# Patient Record
Sex: Male | Born: 1960 | Race: White | Hispanic: No | Smoking: Current some day smoker
Health system: Southern US, Community
[De-identification: ages and names within clinical notes are randomized; demographics above are authoritative.]

## PROBLEM LIST (undated history)

## (undated) DIAGNOSIS — R0600 Dyspnea, unspecified: Secondary | ICD-10-CM

## (undated) DIAGNOSIS — H9190 Unspecified hearing loss, unspecified ear: Secondary | ICD-10-CM

## (undated) DIAGNOSIS — K8689 Other specified diseases of pancreas: Secondary | ICD-10-CM

## (undated) DIAGNOSIS — N2889 Other specified disorders of kidney and ureter: Secondary | ICD-10-CM

## (undated) DIAGNOSIS — J449 Chronic obstructive pulmonary disease, unspecified: Secondary | ICD-10-CM

## (undated) DIAGNOSIS — I10 Essential (primary) hypertension: Secondary | ICD-10-CM

## (undated) DIAGNOSIS — K219 Gastro-esophageal reflux disease without esophagitis: Secondary | ICD-10-CM

## (undated) HISTORY — DX: Unspecified hearing loss, unspecified ear: H91.90

## (undated) HISTORY — PX: OPEN REDUCTION INTERNAL FIXATION (ORIF) CALCANEAL FRACTURE WITH FUSION: SHX5697

## (undated) HISTORY — DX: Other specified disorders of kidney and ureter: N28.89

## (undated) HISTORY — DX: Gastro-esophageal reflux disease without esophagitis: K21.9

## (undated) HISTORY — DX: Other specified diseases of pancreas: K86.89

## (undated) HISTORY — DX: Essential (primary) hypertension: I10

## (undated) HISTORY — PX: OTHER SURGICAL HISTORY: SHX169

---

## 2007-08-16 ENCOUNTER — Other Ambulatory Visit: Payer: Self-pay

## 2007-08-16 ENCOUNTER — Ambulatory Visit: Payer: Self-pay | Admitting: Gastroenterology

## 2008-04-07 ENCOUNTER — Emergency Department: Payer: Self-pay | Admitting: Emergency Medicine

## 2008-04-07 ENCOUNTER — Other Ambulatory Visit: Payer: Self-pay

## 2008-04-24 ENCOUNTER — Ambulatory Visit: Payer: Self-pay | Admitting: Gastroenterology

## 2008-05-09 ENCOUNTER — Ambulatory Visit: Payer: Self-pay | Admitting: Gastroenterology

## 2009-02-06 ENCOUNTER — Emergency Department: Payer: Self-pay | Admitting: Emergency Medicine

## 2016-03-05 ENCOUNTER — Telehealth: Payer: Self-pay

## 2016-03-05 MED ORDER — LOSARTAN POTASSIUM-HCTZ 50-12.5 MG PO TABS: 1.0000 | ORAL_TABLET | Freq: Every day | ORAL | Status: DC

## 2016-03-05 MED ORDER — NEBIVOLOL HCL 5 MG PO TABS: 5.0000 mg | ORAL_TABLET | Freq: Every day | ORAL | Status: DC

## 2016-03-05 NOTE — Telephone Encounter (Signed)
Authorized 30 day refill until he can be seen.

## 2016-03-05 NOTE — Telephone Encounter (Signed)
Patient has new patient appt on 03/23/16 and is our of medication.  I have updated pharmacy in patients chart.

## 2016-03-23 ENCOUNTER — Ambulatory Visit (INDEPENDENT_AMBULATORY_CARE_PROVIDER_SITE_OTHER): Payer: Medicare HMO | Admitting: Family Medicine

## 2016-03-23 ENCOUNTER — Encounter: Payer: Self-pay | Admitting: Family Medicine

## 2016-03-23 VITALS — BP 117/76 | HR 77 | Temp 98.8°F | Resp 16 | Ht 64.0 in | Wt 172.4 lb

## 2016-03-23 DIAGNOSIS — Z7189 Other specified counseling: Secondary | ICD-10-CM | POA: Diagnosis not present

## 2016-03-23 DIAGNOSIS — I10 Essential (primary) hypertension: Secondary | ICD-10-CM | POA: Diagnosis not present

## 2016-03-23 DIAGNOSIS — Z7689 Persons encountering health services in other specified circumstances: Secondary | ICD-10-CM

## 2016-03-23 DIAGNOSIS — K219 Gastro-esophageal reflux disease without esophagitis: Secondary | ICD-10-CM | POA: Diagnosis not present

## 2016-03-23 MED ORDER — LOSARTAN POTASSIUM-HCTZ 50-12.5 MG PO TABS: 1.0000 | ORAL_TABLET | Freq: Every day | ORAL | Status: DC

## 2016-03-23 MED ORDER — RANITIDINE HCL 150 MG PO TABS: 150.0000 mg | ORAL_TABLET | Freq: Two times a day (BID) | ORAL | Status: DC

## 2016-03-23 MED ORDER — NEBIVOLOL HCL 5 MG PO TABS: 5.0000 mg | ORAL_TABLET | Freq: Every day | ORAL | Status: DC

## 2016-03-23 NOTE — Progress Notes (Signed)
Subjective:    Patient ID: Eugene Burton, male    DOB: [birth year], 55 y.o.   MRN: [id number]  HPI: Eugene Burton is a 55 y.o. male presenting on 03/23/2016 for Establish Care   HPI  Pt presents to establish care today. Previous care provider was Dr. Laurian Brim  It has been 6 months since His last PCP visit. Records from previous provider will be requested and reviewed. 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 His wife is interpreting for him today.   Hypertension: Taking losartan-hctz, and bystolic once daily. Controlled well. No dizziness. No chest pain. No visual changes.  Acid reflux: Has been on prevacid- had narrowing of esophagus. Was told to take prevacid. Does well. Has tried without it and would have dysphagia.   Health maintenance: Declines colonoscopy or PSA screening.    Pt and wife are reluctant to get lab work.   Past Medical History  Diagnosis Date  . Hypertension   . GERD (gastroesophageal reflux disease)    Social History   Social History  . Marital Status: Married    Spouse Name: N/A  . Number of Children: N/A  . Years of Education: N/A   Occupational History  . Not on file.   Social History Main Topics  . Smoking status: Current Some Day Smoker -- 0.50 packs/day  . Smokeless tobacco: Not on file  . Alcohol Use: No  . Drug Use: No  . Sexual Activity: Not on file   Other Topics Concern  . Not on file   Social History Narrative  . No narrative on file   Family History  Problem Relation Age of Onset  . Deafness Sister    No current outpatient prescriptions on file prior to visit.   No current facility-administered medications on file prior to visit.    Review of Systems  Constitutional: Negative for fever and chills.  HENT: Negative.   Respiratory: Negative for chest tightness, shortness of breath and wheezing.   Cardiovascular: Negative for chest pain, palpitations and leg swelling.    Gastrointestinal: Negative for nausea, vomiting and abdominal pain.  Endocrine: Negative.   Genitourinary: Negative for dysuria, urgency, discharge, penile pain and testicular pain.  Musculoskeletal: Negative for back pain, joint swelling and arthralgias.  Skin: Negative.   Neurological: Negative for dizziness, weakness, numbness and headaches.  Psychiatric/Behavioral: Negative for sleep disturbance and dysphoric mood.   Per HPI unless specifically indicated above     Objective:    BP 117/76 mmHg  Pulse 77  Temp(Src) 98.8 F (37.1 C) (Oral)  Resp 16  Ht 5\' 4"  (1.626 m)  Wt 172 lb 6.4 oz (78.2 kg)  BMI 29.58 kg/m2  Wt Readings from Last 3 Encounters:  03/23/16 172 lb 6.4 oz (78.2 kg)    Physical Exam  Constitutional: He is oriented to person, place, and time. He appears well-developed and well-nourished. No distress.  HENT:  Head: Normocephalic and atraumatic.  Neck: Neck supple. No thyromegaly present.  Cardiovascular: Normal rate, regular rhythm and normal heart sounds.  Exam reveals no gallop and no friction rub.   No murmur heard. Pulmonary/Chest: Effort normal and breath sounds normal. He has no wheezes.  Abdominal: Soft. Bowel sounds are normal. He exhibits no distension. There is no tenderness. There is no rebound.  Musculoskeletal: Normal range of motion. He exhibits no edema or tenderness.  Neurological: He is alert and oriented to person, place, and time. He has normal reflexes.  Skin: Skin is warm and dry. No rash noted. No erythema.  Psychiatric: He has a normal mood and affect. His behavior is normal. Thought content normal.   No results found for this or any previous visit.    Assessment & Plan:   Problem List Items Addressed This Visit      Cardiovascular and Mediastinum   HTN (hypertension)    Have stressed the importance of getting regular labwork to patient and wife. Would like to check baseline kidney function given diurectics. Pt and wife do not want  labwork today- have asked that it be done in the next 6 mos.  Renewed medications.  RTC 3 mos.  CMP and lipid ordered.       Relevant Medications   losartan-hydrochlorothiazide (HYZAAR) 50-12.5 MG tablet   nebivolol (BYSTOLIC) 5 MG tablet   Other Relevant Orders   COMPLETE METABOLIC PANEL WITH GFR   Lipid Profile     Digestive   GERD (gastroesophageal reflux disease)    Trial of ranitidine due to effects of long-term PPI. Ranitidine BID. Check CBC. Recheck 3 mos.       Relevant Medications   ranitidine (ZANTAC) 150 MG tablet   Other Relevant Orders   CBC with Differential/Platelet    Other Visit Diagnoses    Encounter to establish care    -  Primary       Meds ordered this encounter  Medications  . DISCONTD: lansoprazole (PREVACID) 15 MG capsule    Sig: Take 15 mg by mouth 2 (two) times daily.   . ranitidine (ZANTAC) 150 MG tablet    Sig: Take 1 tablet (150 mg total) by mouth 2 (two) times daily.    Dispense:  60 tablet    Refill:  11    Order Specific Question:  Supervising Provider    Answer:  Arlis Porta [phone]  . losartan-hydrochlorothiazide (HYZAAR) 50-12.5 MG tablet    Sig: Take 1 tablet by mouth daily.    Dispense:  30 tablet    Refill:  5    Order Specific Question:  Supervising Provider    Answer:  Arlis Porta [id number]  . nebivolol (BYSTOLIC) 5 MG tablet    Sig: Take 1 tablet (5 mg total) by mouth daily.    Dispense:  30 tablet    Refill:  5    Order Specific Question:  Supervising Provider    Answer:  Arlis Porta [id number]      Follow up plan: Return in about 3 months (around 06/23/2016), or if symptoms worsen or fail to improve, for hypertension. Marland Kitchen

## 2016-03-23 NOTE — Assessment & Plan Note (Signed)
Trial of ranitidine due to effects of long-term PPI. Ranitidine BID. Check CBC. Recheck 3 mos.

## 2016-03-23 NOTE — Assessment & Plan Note (Signed)
Have stressed the importance of getting regular labwork to patient and wife. Would like to check baseline kidney function given diurectics. Pt and wife do not want labwork today- have asked that it be done in the next 6 mos.  Renewed medications.  RTC 3 mos.  CMP and lipid ordered.

## 2016-03-23 NOTE — Patient Instructions (Signed)
Your goal blood pressure is  1490/90. Work on low salt/sodium diet - goal <1.5gm (1,500mg ) per day. Eat a diet high in fruits/vegetables and whole grains.  Look into mediterranean and DASH diet. Goal activity is 120min/wk of moderate intensity exercise.  This can be split into 30 minute chunks.  If you are not at this level, you can start with smaller 10-15 min increments and slowly build up activity. Look at [place]org for more resources  It is important that we get lab work on you today to ensure it is safe to continue to give your medications.  You can make an appt anytime to get your labs done.

## 2016-09-28 ENCOUNTER — Other Ambulatory Visit: Payer: Self-pay | Admitting: Family Medicine

## 2016-09-28 DIAGNOSIS — I10 Essential (primary) hypertension: Secondary | ICD-10-CM

## 2016-09-28 MED ORDER — LOSARTAN POTASSIUM-HCTZ 50-12.5 MG PO TABS: 1.0000 | ORAL_TABLET | Freq: Every day | ORAL | 2 refills | Status: DC

## 2016-10-27 ENCOUNTER — Other Ambulatory Visit: Payer: Self-pay | Admitting: *Deleted

## 2016-10-27 DIAGNOSIS — I10 Essential (primary) hypertension: Secondary | ICD-10-CM

## 2016-10-27 MED ORDER — NEBIVOLOL HCL 5 MG PO TABS: 5.0000 mg | ORAL_TABLET | Freq: Every day | ORAL | 1 refills | Status: DC

## 2018-05-10 ENCOUNTER — Ambulatory Visit: Payer: Medicare HMO

## 2018-05-23 ENCOUNTER — Ambulatory Visit: Payer: Self-pay

## 2018-06-01 ENCOUNTER — Ambulatory Visit: Payer: Self-pay | Admitting: Urology

## 2018-06-01 VITALS — BP 147/90 | HR 94 | Temp 98.6°F | Ht 64.0 in | Wt 137.2 lb

## 2018-06-01 DIAGNOSIS — K219 Gastro-esophageal reflux disease without esophagitis: Secondary | ICD-10-CM

## 2018-06-01 DIAGNOSIS — I10 Essential (primary) hypertension: Secondary | ICD-10-CM

## 2018-06-01 MED ORDER — NEBIVOLOL HCL 5 MG PO TABS: 5.0000 mg | ORAL_TABLET | Freq: Every day | ORAL | 1 refills | Status: DC

## 2018-06-01 MED ORDER — RANITIDINE HCL 150 MG PO TABS: 150.0000 mg | ORAL_TABLET | Freq: Two times a day (BID) | ORAL | 1 refills | Status: DC

## 2018-06-01 MED ORDER — LOSARTAN POTASSIUM-HCTZ 50-12.5 MG PO TABS
1.0000 | ORAL_TABLET | Freq: Every day | ORAL | 1 refills | Status: DC
Start: 2018-06-01 — End: 2018-08-03

## 2018-06-01 NOTE — Progress Notes (Signed)
  Patient: Eugene Burton Male    DOB: Jan 15, 1961   57 y.o.   MRN: [id number] Visit Date: 06/01/2018  Today's Provider: Zara Council, PA-C   Chief Complaint  Patient presents with  . Annual Exam    new patient, needs medication review and managment    Subjective:    HPI Patient states his glasses are old and he is having trouble seeing and would like an eye exam  Has HTN and GERD - been without medication since 2018/02/13  His wife died suddenly in 16-May-2018    No Known Allergies Previous Medications   LOSARTAN-HYDROCHLOROTHIAZIDE (HYZAAR) 50-12.5 MG TABLET    Take 1 tablet by mouth daily.   NEBIVOLOL (BYSTOLIC) 5 MG TABLET    Take 1 tablet (5 mg total) by mouth daily.   RANITIDINE (ZANTAC) 150 MG TABLET    Take 1 tablet (150 mg total) by mouth 2 (two) times daily.    Review of Systems  Constitutional: Positive for appetite change.    Social History   Tobacco Use  . Smoking status: Current Some Day Smoker    Packs/day: 1.00    Types: Cigarettes  . Smokeless tobacco: Never Used  . Tobacco comment: materials given to patient as he is "slightly" interested in quittig  Substance Use Topics  . Alcohol use: No   Objective:   BP (!) 147/90   Pulse 94   Temp 98.6 F (37 C)   Ht 5\' 4"  (1.626 m)   Wt 137 lb 3.2 oz (62.2 kg)   BMI 23.55 kg/m   Physical Exam  Constitutional: He is oriented to person, place, and time. He appears well-developed.  Eyes: Pupils are equal, round, and reactive to light.  Neck: Normal range of motion.  Cardiovascular: Normal rate, regular rhythm and normal heart sounds.  Pulmonary/Chest: Effort normal and breath sounds normal.  Abdominal: Soft. Bowel sounds are normal.  Musculoskeletal: Normal range of motion.  Neurological: He is oriented to person, place, and time.  Skin: Skin is warm and dry.        Assessment & Plan:     1.HTN  meds refilled  Labs drawn  2. GERD   Zantac refilled  3. Depression  seeig [facility]       Zara Council, PA-C   [facility]

## 2018-06-02 LAB — CBC WITH DIFFERENTIAL/PLATELET
BASOS ABS: 0 10*3/uL (ref 0.0–0.2)
Basos: 0 %
EOS (ABSOLUTE): 0.1 10*3/uL (ref 0.0–0.4)
Eos: 2 %
Hematocrit: 36.7 % — ABNORMAL LOW (ref 37.5–51.0)
Hemoglobin: 12 g/dL — ABNORMAL LOW (ref 13.0–17.7)
Immature Grans (Abs): 0 10*3/uL (ref 0.0–0.1)
Immature Granulocytes: 0 %
LYMPHS: 21 %
Lymphocytes Absolute: 1.6 10*3/uL (ref 0.7–3.1)
MCH: 28.9 pg (ref 26.6–33.0)
MCHC: 32.7 g/dL (ref 31.5–35.7)
MCV: 88 fL (ref 79–97)
MONOS ABS: 0.6 10*3/uL (ref 0.1–0.9)
Monocytes: 8 %
NEUTROS ABS: 5.5 10*3/uL (ref 1.4–7.0)
Neutrophils: 69 %
Platelets: 382 10*3/uL (ref 150–450)
RBC: 4.15 x10E6/uL (ref 4.14–5.80)
RDW: 15.8 % — AB (ref 12.3–15.4)
WBC: 7.9 10*3/uL (ref 3.4–10.8)

## 2018-06-02 LAB — COMPREHENSIVE METABOLIC PANEL
A/G RATIO: 1.7 (ref 1.2–2.2)
ALT: 12 IU/L (ref 0–44)
AST: 17 IU/L (ref 0–40)
Albumin: 4.1 g/dL (ref 3.5–5.5)
Alkaline Phosphatase: 120 IU/L — ABNORMAL HIGH (ref 39–117)
BILIRUBIN TOTAL: 0.3 mg/dL (ref 0.0–1.2)
BUN / CREAT RATIO: 10 (ref 9–20)
BUN: 11 mg/dL (ref 6–24)
CO2: 26 mmol/L (ref 20–29)
Calcium: 9.9 mg/dL (ref 8.7–10.2)
Chloride: 98 mmol/L (ref 96–106)
Creatinine, Ser: 1.1 mg/dL (ref 0.76–1.27)
GFR calc non Af Amer: 75 mL/min/{1.73_m2} (ref >59)
GFR, EST AFRICAN AMERICAN: 87 mL/min/{1.73_m2} (ref >59)
GLOBULIN, TOTAL: 2.4 g/dL (ref 1.5–4.5)
Glucose: 95 mg/dL (ref 65–99)
POTASSIUM: 4 mmol/L (ref 3.5–5.2)
SODIUM: 141 mmol/L (ref 134–144)
TOTAL PROTEIN: 6.5 g/dL (ref 6.0–8.5)

## 2018-06-02 LAB — TSH: TSH: 2 u[IU]/mL (ref 0.450–4.500)

## 2018-06-02 LAB — PSA: PROSTATE SPECIFIC AG, SERUM: 1.2 ng/mL (ref 0.0–4.0)

## 2018-06-02 LAB — LIPID PANEL
CHOL/HDL RATIO: 4.4 ratio (ref 0.0–5.0)
Cholesterol, Total: 146 mg/dL (ref 100–199)
HDL: 33 mg/dL — ABNORMAL LOW (ref >39)
LDL Calculated: 88 mg/dL (ref 0–99)
Triglycerides: 127 mg/dL (ref 0–149)
VLDL Cholesterol Cal: 25 mg/dL (ref 5–40)

## 2018-06-09 ENCOUNTER — Telehealth: Payer: Self-pay | Admitting: Pharmacy Technician

## 2018-06-09 NOTE — Telephone Encounter (Signed)
Insurance check revealed patient has [employer] to cover medical and pharmacy costs.  Attempted to call patient.  Phone number on file disconnected.  [facility] is unable to provide medication assistance since patient has prescription coverage.  Notifying patient by letter.  [facility]

## 2018-06-15 ENCOUNTER — Ambulatory Visit: Payer: Medicare HMO | Admitting: Ophthalmology

## 2018-06-23 ENCOUNTER — Ambulatory Visit: Payer: Self-pay

## 2018-06-29 ENCOUNTER — Encounter: Payer: Self-pay | Admitting: Gerontology

## 2018-06-29 ENCOUNTER — Ambulatory Visit: Payer: Self-pay | Admitting: Adult Health

## 2018-06-29 ENCOUNTER — Ambulatory Visit: Payer: Medicare HMO | Admitting: Gerontology

## 2018-06-29 ENCOUNTER — Other Ambulatory Visit: Payer: Self-pay

## 2018-06-29 VITALS — BP 125/77 | HR 110 | Temp 97.7°F | Wt 134.4 lb

## 2018-06-29 DIAGNOSIS — K219 Gastro-esophageal reflux disease without esophagitis: Secondary | ICD-10-CM

## 2018-06-29 DIAGNOSIS — I10 Essential (primary) hypertension: Secondary | ICD-10-CM

## 2018-06-29 NOTE — Progress Notes (Signed)
  Patient: Eugene Burton Male    DOB: 1961-06-20   57 y.o.   MRN: [id number] Visit Date: 06/29/2018  Today's Provider: Langston Reusing, NP   Chief Complaint  Patient presents with  . Follow-up    hypertension   Subjective:    HPI [name] 57 y/o male presents for f/u with HTN, Gerd. Has a sign language interpreter with him. He denies chest pain, palpitation, shortness of breath, but continues to smoke, states he will quit on [birth year] which is his birthday. He states taking medicines as ordered.Patient went for eye exam and has new glasses. Denies any depressive mood.    No Known Allergies Previous Medications   LOSARTAN-HYDROCHLOROTHIAZIDE (HYZAAR) 50-12.5 MG TABLET    Take 1 tablet by mouth daily.   NEBIVOLOL (BYSTOLIC) 5 MG TABLET    Take 1 tablet (5 mg total) by mouth daily.   RANITIDINE (ZANTAC) 150 MG TABLET    Take 1 tablet (150 mg total) by mouth 2 (two) times daily.    Review of Systems  Constitutional: Negative.   HENT: Negative.   Eyes: Negative.   Respiratory: Negative.   Cardiovascular: Negative.   Gastrointestinal: Negative.   Endocrine: Negative.   Genitourinary: Negative.   Musculoskeletal: Negative.   Skin: Negative.   Neurological: Negative.   Psychiatric/Behavioral: Negative.     Social History   Tobacco Use  . Smoking status: Current Some Day Smoker    Packs/day: 1.00    Types: Cigarettes  . Smokeless tobacco: Never Used  . Tobacco comment: materials given to patient as he is "slightly" interested in quittig  Substance Use Topics  . Alcohol use: No   Objective:   BP 125/77 (BP Location: Left Arm, Patient Position: Sitting)   Pulse (!) 110   Temp 97.7 F (36.5 C)   Wt 134 lb 6.4 oz (61 kg)   SpO2 99%   BMI 23.07 kg/m   Physical Exam  Constitutional: He is oriented to person, place, and time. He appears well-developed and well-nourished.  HENT:  Head: Normocephalic and atraumatic.  Eyes: Pupils are equal, round, and  reactive to light. Conjunctivae and EOM are normal.  Neck: Normal range of motion.  Cardiovascular: Tachycardia present.  Pulmonary/Chest: Effort normal and breath sounds normal.  Abdominal: Soft. Bowel sounds are normal.  Musculoskeletal: Normal range of motion.  Neurological: He is alert and oriented to person, place, and time.  Skin: Skin is warm and dry.  Psychiatric: He has a normal mood and affect. His behavior is normal. Judgment and thought content normal.        Assessment & Plan:     Essential hypertension  Gastroesophageal reflux disease without esophagitis    Essential Hypertension:    Continues to take Losartan-hydrochlorothiazide 50-12.5mg  daily, and nebivolol 5 mg daily .blood pressure is within normal limit. smoking cessation advised, EKG for tachycardia. Follow up next week with BP and HR nurse check  Gerd:  Continue taking 150 mg Zantac twice daily     Austynn Pridmore Jerold Coombe, NP   [facility]

## 2018-07-06 ENCOUNTER — Ambulatory Visit: Payer: Medicare HMO

## 2018-07-06 ENCOUNTER — Other Ambulatory Visit: Payer: Self-pay

## 2018-07-06 VITALS — BP 134/84 | HR 137

## 2018-07-06 DIAGNOSIS — I10 Essential (primary) hypertension: Secondary | ICD-10-CM

## 2018-07-06 MED ORDER — NEBIVOLOL HCL 5 MG PO TABS: 5.0000 mg | ORAL_TABLET | Freq: Every day | ORAL | 1 refills | Status: DC

## 2018-07-06 NOTE — Patient Instructions (Signed)

## 2018-07-06 NOTE — Progress Notes (Signed)
Patient was seen no c/o patient had not received his Bystolic from the pharmacy.  We resent the medication to [facility] on [address] and ensured it had been received.  Patients BP and HR was reviewed by Langston Reusing, NP.  She advised patient to go to ER for HR.  Called patient to inform him because of lack of transportation patient asked if he could wait to go to ER.  Because the HR was not extremely high the patient was informed to watch for SOB, dizziness, palpitations and clamminess. Patient will pick up his Beta Blocker from the pharmacy and return on 10/29 for a recheck on his HR. Subjective:    Patient here for follow-up of elevated blood pressure.  He  exercising and  adherent to a low-salt diet.  Blood pressure  well controlled at home. Cardiac symptoms: . Patient denies: . Cardiovascular risk factors: . Use of agents associated with hypertension: . History of target organ damage: .    Review of Systems      Objective:        Assessment:    Hypertension,  ***. Evidence of target organ damage: .    Plan:

## 2018-08-03 ENCOUNTER — Other Ambulatory Visit: Payer: Self-pay

## 2018-08-03 ENCOUNTER — Ambulatory Visit: Payer: Medicare HMO | Admitting: Gerontology

## 2018-08-03 ENCOUNTER — Encounter: Payer: Self-pay | Admitting: Gerontology

## 2018-08-03 ENCOUNTER — Other Ambulatory Visit: Payer: Self-pay | Admitting: Urology

## 2018-08-03 VITALS — BP 150/87 | HR 96 | Wt 128.0 lb

## 2018-08-03 DIAGNOSIS — I10 Essential (primary) hypertension: Secondary | ICD-10-CM

## 2018-08-03 DIAGNOSIS — K219 Gastro-esophageal reflux disease without esophagitis: Secondary | ICD-10-CM

## 2018-08-03 DIAGNOSIS — R05 Cough: Secondary | ICD-10-CM

## 2018-08-03 DIAGNOSIS — R059 Cough, unspecified: Secondary | ICD-10-CM

## 2018-08-03 MED ORDER — BENZONATATE 100 MG PO CAPS: 100.0000 mg | ORAL_CAPSULE | Freq: Three times a day (TID) | ORAL | 0 refills | Status: DC | PRN

## 2018-08-03 MED ORDER — LOSARTAN POTASSIUM-HCTZ 50-12.5 MG PO TABS: 1.0000 | ORAL_TABLET | Freq: Every day | ORAL | 3 refills | Status: DC

## 2018-08-03 MED ORDER — NEBIVOLOL HCL 5 MG PO TABS: 5.0000 mg | ORAL_TABLET | Freq: Every day | ORAL | 3 refills | Status: DC

## 2018-08-03 MED ORDER — RANITIDINE HCL 150 MG PO TABS: 150.0000 mg | ORAL_TABLET | Freq: Two times a day (BID) | ORAL | 3 refills | Status: DC
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
## 2018-09-18 ENCOUNTER — Emergency Department: Payer: Medicare HMO

## 2018-09-18 ENCOUNTER — Encounter: Payer: Self-pay | Admitting: Emergency Medicine

## 2018-09-18 ENCOUNTER — Other Ambulatory Visit: Payer: Self-pay

## 2018-09-18 ENCOUNTER — Inpatient Hospital Stay
Admission: EM | Admit: 2018-09-18 | Discharge: 2018-09-22 | DRG: 843 | Disposition: A | Discharge status: Home / Self Care | Payer: Medicare HMO | Attending: Internal Medicine | Admitting: Internal Medicine

## 2018-09-18 DIAGNOSIS — J9601 Acute respiratory failure with hypoxia: Secondary | ICD-10-CM | POA: Diagnosis not present

## 2018-09-18 DIAGNOSIS — C801 Malignant (primary) neoplasm, unspecified: Secondary | ICD-10-CM | POA: Diagnosis not present

## 2018-09-18 DIAGNOSIS — C786 Secondary malignant neoplasm of retroperitoneum and peritoneum: Secondary | ICD-10-CM | POA: Diagnosis present

## 2018-09-18 DIAGNOSIS — Z981 Arthrodesis status: Secondary | ICD-10-CM

## 2018-09-18 DIAGNOSIS — E860 Dehydration: Secondary | ICD-10-CM | POA: Diagnosis not present

## 2018-09-18 DIAGNOSIS — M25551 Pain in right hip: Secondary | ICD-10-CM | POA: Diagnosis present

## 2018-09-18 DIAGNOSIS — M6250 Muscle wasting and atrophy, not elsewhere classified, unspecified site: Secondary | ICD-10-CM | POA: Diagnosis present

## 2018-09-18 DIAGNOSIS — C7951 Secondary malignant neoplasm of bone: Secondary | ICD-10-CM | POA: Diagnosis present

## 2018-09-18 DIAGNOSIS — G4700 Insomnia, unspecified: Secondary | ICD-10-CM | POA: Diagnosis present

## 2018-09-18 DIAGNOSIS — E440 Moderate protein-calorie malnutrition: Secondary | ICD-10-CM

## 2018-09-18 DIAGNOSIS — K219 Gastro-esophageal reflux disease without esophagitis: Secondary | ICD-10-CM | POA: Diagnosis present

## 2018-09-18 DIAGNOSIS — R64 Cachexia: Secondary | ICD-10-CM | POA: Diagnosis present

## 2018-09-18 DIAGNOSIS — H913 Deaf nonspeaking, not elsewhere classified: Secondary | ICD-10-CM | POA: Diagnosis present

## 2018-09-18 DIAGNOSIS — D630 Anemia in neoplastic disease: Secondary | ICD-10-CM | POA: Diagnosis present

## 2018-09-18 DIAGNOSIS — C787 Secondary malignant neoplasm of liver and intrahepatic bile duct: Secondary | ICD-10-CM | POA: Diagnosis present

## 2018-09-18 DIAGNOSIS — C799 Secondary malignant neoplasm of unspecified site: Secondary | ICD-10-CM | POA: Diagnosis present

## 2018-09-18 DIAGNOSIS — C7889 Secondary malignant neoplasm of other digestive organs: Secondary | ICD-10-CM | POA: Diagnosis present

## 2018-09-18 DIAGNOSIS — E871 Hypo-osmolality and hyponatremia: Secondary | ICD-10-CM | POA: Diagnosis present

## 2018-09-18 DIAGNOSIS — E878 Other disorders of electrolyte and fluid balance, not elsewhere classified: Secondary | ICD-10-CM | POA: Diagnosis present

## 2018-09-18 DIAGNOSIS — Z79899 Other long term (current) drug therapy: Secondary | ICD-10-CM

## 2018-09-18 DIAGNOSIS — F1010 Alcohol abuse, uncomplicated: Secondary | ICD-10-CM | POA: Diagnosis present

## 2018-09-18 DIAGNOSIS — E43 Unspecified severe protein-calorie malnutrition: Secondary | ICD-10-CM | POA: Diagnosis present

## 2018-09-18 DIAGNOSIS — G893 Neoplasm related pain (acute) (chronic): Secondary | ICD-10-CM | POA: Diagnosis present

## 2018-09-18 DIAGNOSIS — Z6820 Body mass index (BMI) 20.0-20.9, adult: Secondary | ICD-10-CM

## 2018-09-18 DIAGNOSIS — M25552 Pain in left hip: Secondary | ICD-10-CM | POA: Diagnosis present

## 2018-09-18 DIAGNOSIS — I10 Essential (primary) hypertension: Secondary | ICD-10-CM | POA: Diagnosis present

## 2018-09-18 DIAGNOSIS — C7900 Secondary malignant neoplasm of unspecified kidney and renal pelvis: Secondary | ICD-10-CM | POA: Diagnosis present

## 2018-09-18 DIAGNOSIS — C7801 Secondary malignant neoplasm of right lung: Secondary | ICD-10-CM | POA: Diagnosis present

## 2018-09-18 DIAGNOSIS — F17210 Nicotine dependence, cigarettes, uncomplicated: Secondary | ICD-10-CM | POA: Diagnosis present

## 2018-09-18 DIAGNOSIS — D696 Thrombocytopenia, unspecified: Secondary | ICD-10-CM | POA: Diagnosis present

## 2018-09-18 DIAGNOSIS — C7802 Secondary malignant neoplasm of left lung: Secondary | ICD-10-CM | POA: Diagnosis present

## 2018-09-18 DIAGNOSIS — E876 Hypokalemia: Secondary | ICD-10-CM | POA: Diagnosis present

## 2018-09-18 DIAGNOSIS — Z808 Family history of malignant neoplasm of other organs or systems: Secondary | ICD-10-CM

## 2018-09-18 LAB — COMPREHENSIVE METABOLIC PANEL
ALT: 17 U/L (ref 0–44)
ANION GAP: 10 (ref 5–15)
AST: 24 U/L (ref 15–41)
Albumin: 3 g/dL — ABNORMAL LOW (ref 3.5–5.0)
Alkaline Phosphatase: 279 U/L — ABNORMAL HIGH (ref 38–126)
BUN: 16 mg/dL (ref 6–20)
CO2: 27 mmol/L (ref 22–32)
Calcium: 8.6 mg/dL — ABNORMAL LOW (ref 8.9–10.3)
Chloride: 93 mmol/L — ABNORMAL LOW (ref 98–111)
Creatinine, Ser: 0.88 mg/dL (ref 0.61–1.24)
GFR calc Af Amer: >60 mL/min (ref >60)
GFR calc non Af Amer: >60 mL/min (ref >60)
Glucose, Bld: 172 mg/dL — ABNORMAL HIGH (ref 70–99)
Potassium: 3.2 mmol/L — ABNORMAL LOW (ref 3.5–5.1)
SODIUM: 130 mmol/L — AB (ref 135–145)
Total Bilirubin: 0.6 mg/dL (ref 0.3–1.2)
Total Protein: 6.3 g/dL — ABNORMAL LOW (ref 6.5–8.1)

## 2018-09-18 LAB — CBC
HEMATOCRIT: 29.1 % — AB (ref 39.0–52.0)
Hemoglobin: 9.6 g/dL — ABNORMAL LOW (ref 13.0–17.0)
MCH: 27.2 pg (ref 26.0–34.0)
MCHC: 33 g/dL (ref 30.0–36.0)
MCV: 82.4 fL (ref 80.0–100.0)
Platelets: 191 10*3/uL (ref 150–400)
RBC: 3.53 MIL/uL — ABNORMAL LOW (ref 4.22–5.81)
RDW: 16.6 % — AB (ref 11.5–15.5)
WBC: 8.5 10*3/uL (ref 4.0–10.5)
nRBC: 0 % (ref 0.0–0.2)

## 2018-09-18 MED ORDER — SODIUM CHLORIDE 0.9 % IV BOLUS
1000.0000 mL | Freq: Once | INTRAVENOUS | Status: AC
  Administered 2018-09-18: 1000 mL via INTRAVENOUS

## 2018-09-18 MED ORDER — IOHEXOL 300 MG/ML  SOLN
75.0000 mL | Freq: Once | INTRAMUSCULAR | Status: AC | PRN
  Administered 2018-09-18: 75 mL via INTRAVENOUS

## 2018-09-18 NOTE — ED Triage Notes (Signed)
Pt sent from pcp for hgb 9.7.  Pt has not felt good for 3 months and has been losing weight.  CXR, cbc, and cmp done at pcp ([facility]).  Pt has been coughing up white sputum for "several months".  Denies blood thinners or black/bloody stools.  Only complaint from patient is that he has not felt well for months. VSS.  Ambulatory.    Interpretor startus [id number], Clarene Critchley

## 2018-09-18 NOTE — ED Provider Notes (Signed)
[facility] Emergency Department Provider Note    First MD Initiated Contact with Patient 09/18/18 2107     (approximate)  I have reviewed the triage vital signs and the nursing notes.   HISTORY  Chief Complaint Abnormal Lab    HPI Eugene Burton is a 58 y.o. male history of GERD and hypertension previous history of smoking does not currently drink alcohol presents the ER with several weeks of generalized malaise, weight loss and cough productive of white phlegm and sputum.  Denies any fevers.  No chest pain.  States that his appetite is decreased because anytime he eats he has abdominal pain.  Not currently on any medications.  Denies any history of liver disease.  No history of cancer that he is aware of.  Stopped smoking 3 days ago.  Was seen at the walk-in clinic where blood work was abnormal though they did not have any previous so he was directed to the ER for further evaluation.    Past Medical History:  Diagnosis Date  . Deaf    patient needs interpreter   . GERD (gastroesophageal reflux disease)   . Hypertension    Family History  Problem Relation Age of Onset  . Cancer Sister   . Cancer Brother        skin cancer   Past Surgical History:  Procedure Laterality Date  . broken leg    . OPEN REDUCTION INTERNAL FIXATION (ORIF) CALCANEAL FRACTURE WITH FUSION Right    right lower leg surgery s/p fracture   Patient Active Problem List   Diagnosis Date Noted  . GERD (gastroesophageal reflux disease) 03/23/2016  . HTN (hypertension) 03/23/2016      Prior to Admission medications   Medication Sig Start Date End Date Taking? Authorizing Provider  benzonatate (TESSALON PERLES) 100 MG capsule Take 1 capsule (100 mg total) by mouth 3 (three) times daily as needed for cough. 08/03/18   Iloabachie, Chioma E, NP  losartan-hydrochlorothiazide (HYZAAR) 50-12.5 MG tablet Take 1 tablet by mouth daily. 08/03/18   Iloabachie, Chioma E, NP  nebivolol  (BYSTOLIC) 5 MG tablet Take 1 tablet (5 mg total) by mouth daily. 08/03/18   Iloabachie, Chioma E, NP  ranitidine (ZANTAC) 150 MG tablet Take 1 tablet (150 mg total) by mouth 2 (two) times daily. 08/03/18   Iloabachie, Chioma E, NP    Allergies Patient has no known allergies.    Social History Social History   Tobacco Use  . Smoking status: Current Some Day Smoker    Packs/day: 1.00    Types: Cigarettes  . Smokeless tobacco: Never Used  . Tobacco comment: materials given to patient as he is "slightly" interested in quittig  Substance Use Topics  . Alcohol use: No  . Drug use: No    Review of Systems Patient denies headaches, rhinorrhea, blurry vision, numbness, shortness of breath, chest pain, edema, cough, abdominal pain, nausea, vomiting, diarrhea, dysuria, fevers, rashes or hallucinations unless otherwise stated above in HPI. ____________________________________________   PHYSICAL EXAM:  VITAL SIGNS: Vitals:   09/18/18 2122 09/18/18 2230  BP: 138/84 (!) 155/90  Pulse: 88 85  Resp: 20 18  Temp:    SpO2: 98% 93%    Constitutional: Alert and oriented. Non toxic appearing but frail appearing Eyes: Conjunctivae are normal.  Head: Atraumatic. Nose: No congestion/rhinnorhea. Mouth/Throat: Mucous membranes are moist.   Neck: No stridor. Painless ROM.  Cardiovascular: Normal rate, regular rhythm. Grossly normal heart sounds.  Good peripheral circulation. Respiratory:  Normal respiratory effort.  No retractions. Lungs without rhonchi. Gastrointestinal: Soft and nontender. No distention. No abdominal bruits. No CVA tenderness. Genitourinary:  Musculoskeletal: No lower extremity tenderness nor edema.  No joint effusions. Neurologic:  Normal speech and language. No gross focal neurologic deficits are appreciated. No facial droop Skin:  Skin is warm, dry and intact. No rash noted. Psychiatric: Mood and affect are normal. Speech and behavior are  normal.  ____________________________________________   LABS (all labs ordered are listed, but only abnormal results are displayed)  Results for orders placed or performed during the hospital encounter of 09/18/18 (from the past 24 hour(s))  CBC     Status: Abnormal   Collection Time: 09/18/18  6:06 PM  Result Value Ref Range   WBC 8.5 4.0 - 10.5 K/uL   RBC 3.53 (L) 4.22 - 5.81 MIL/uL   Hemoglobin 9.6 (L) 13.0 - 17.0 g/dL   HCT 29.1 (L) 39.0 - 52.0 %   MCV 82.4 80.0 - 100.0 fL   MCH 27.2 26.0 - 34.0 pg   MCHC 33.0 30.0 - 36.0 g/dL   RDW 16.6 (H) 11.5 - 15.5 %   Platelets 191 150 - 400 K/uL   nRBC 0.0 0.0 - 0.2 %  Comprehensive metabolic panel     Status: Abnormal   Collection Time: 09/18/18  6:06 PM  Result Value Ref Range   Sodium 130 (L) 135 - 145 mmol/L   Potassium 3.2 (L) 3.5 - 5.1 mmol/L   Chloride 93 (L) 98 - 111 mmol/L   CO2 27 22 - 32 mmol/L   Glucose, Bld 172 (H) 70 - 99 mg/dL   BUN 16 6 - 20 mg/dL   Creatinine, Ser 0.88 0.61 - 1.24 mg/dL   Calcium 8.6 (L) 8.9 - 10.3 mg/dL   Total Protein 6.3 (L) 6.5 - 8.1 g/dL   Albumin 3.0 (L) 3.5 - 5.0 g/dL   AST 24 15 - 41 U/L   ALT 17 0 - 44 U/L   Alkaline Phosphatase 279 (H) 38 - 126 U/L   Total Bilirubin 0.6 0.3 - 1.2 mg/dL   GFR calc non Af Amer >60 >60 mL/min   GFR calc Af Amer >60 >60 mL/min   Anion gap 10 5 - 15  Type and screen [place]     Status: None   Collection Time: 09/18/18  6:08 PM  Result Value Ref Range   ABO/RH(D) O POS    Antibody Screen POS    Sample Expiration 09/21/2018    Antibody Identification      ANTI S Performed at [facility], [address] [place], [place] [id number]    ____________________________________________ ____________________________________________  RADIOLOGY  I personally reviewed all radiographic images ordered to evaluate for the above acute complaints and reviewed radiology reports and findings.  These findings were personally  discussed with the patient.  Please see medical record for radiology report.  ____________________________________________   PROCEDURES  Procedure(s) performed:  Procedures    Critical Care performed: no ____________________________________________   INITIAL IMPRESSION / ASSESSMENT AND PLAN / ED COURSE  Pertinent labs & imaging results that were available during my care of the patient were reviewed by me and considered in my medical decision making (see chart for details).   DDX: mass, dehydration, medication effect, electrolyte abn, pna  Eugene Burton is a 58 y.o. who presents to the ED with symptoms as described above.  Patient is AFVSS in ED. Exam as above. Given current presentation  have considered the above differential.  Patient with mild hyponatremia muscle wasting and symptoms as described above.  Presentation certainly concerning for malignancy therefore IV fluids started chest x-ray was repeated and CT imaging of the chest abdomen pelvis was ordered to evaluate for the above differential.  Patient with extensive metastatic cancer.  Uncertain primary but his CT scan is absolutely riddled with cancer.  Given patient's dehydration and not tolerating p.o. will admit for IV hydration symptomatic management.       As part of my medical decision making, I reviewed the following data within the [facility] notes reviewed and incorporated, Labs reviewed, notes from prior ED visits.   ____________________________________________   FINAL CLINICAL IMPRESSION(S) / ED DIAGNOSES  Final diagnoses:  Metastatic cancer ([facility])  Hyponatremia      NEW MEDICATIONS STARTED DURING THIS VISIT:  New Prescriptions   No medications on file     Note:  This document was prepared using Dragon voice recognition software and may include unintentional dictation errors.    Merlyn Lot, MD 09/19/18 [facility]

## 2018-09-18 NOTE — ED Notes (Signed)
Pt went to CT

## 2018-09-18 NOTE — ED Notes (Signed)
Pt back in room from CT scan. Awaits results at this time. Wife remains at bedside.

## 2018-09-18 NOTE — ED Notes (Signed)
Mallie Mussel RN called the lab to add the ordered labs to the already drawn blood. Lab stated that they will add it.

## 2018-09-19 ENCOUNTER — Encounter: Payer: Self-pay | Admitting: Internal Medicine

## 2018-09-19 ENCOUNTER — Telehealth: Payer: Self-pay | Admitting: Internal Medicine

## 2018-09-19 DIAGNOSIS — D649 Anemia, unspecified: Secondary | ICD-10-CM | POA: Diagnosis not present

## 2018-09-19 DIAGNOSIS — M25551 Pain in right hip: Secondary | ICD-10-CM | POA: Diagnosis present

## 2018-09-19 DIAGNOSIS — C7801 Secondary malignant neoplasm of right lung: Secondary | ICD-10-CM | POA: Diagnosis present

## 2018-09-19 DIAGNOSIS — C801 Malignant (primary) neoplasm, unspecified: Secondary | ICD-10-CM | POA: Diagnosis present

## 2018-09-19 DIAGNOSIS — C799 Secondary malignant neoplasm of unspecified site: Secondary | ICD-10-CM | POA: Diagnosis present

## 2018-09-19 DIAGNOSIS — E860 Dehydration: Secondary | ICD-10-CM | POA: Diagnosis present

## 2018-09-19 DIAGNOSIS — F17210 Nicotine dependence, cigarettes, uncomplicated: Secondary | ICD-10-CM

## 2018-09-19 DIAGNOSIS — C7889 Secondary malignant neoplasm of other digestive organs: Secondary | ICD-10-CM | POA: Diagnosis present

## 2018-09-19 DIAGNOSIS — E876 Hypokalemia: Secondary | ICD-10-CM | POA: Diagnosis present

## 2018-09-19 DIAGNOSIS — C787 Secondary malignant neoplasm of liver and intrahepatic bile duct: Secondary | ICD-10-CM | POA: Diagnosis present

## 2018-09-19 DIAGNOSIS — C786 Secondary malignant neoplasm of retroperitoneum and peritoneum: Secondary | ICD-10-CM | POA: Diagnosis present

## 2018-09-19 DIAGNOSIS — F1010 Alcohol abuse, uncomplicated: Secondary | ICD-10-CM | POA: Diagnosis present

## 2018-09-19 DIAGNOSIS — D378 Neoplasm of uncertain behavior of other specified digestive organs: Secondary | ICD-10-CM

## 2018-09-19 DIAGNOSIS — C7951 Secondary malignant neoplasm of bone: Secondary | ICD-10-CM | POA: Diagnosis present

## 2018-09-19 DIAGNOSIS — D696 Thrombocytopenia, unspecified: Secondary | ICD-10-CM

## 2018-09-19 DIAGNOSIS — C7900 Secondary malignant neoplasm of unspecified kidney and renal pelvis: Secondary | ICD-10-CM | POA: Diagnosis present

## 2018-09-19 DIAGNOSIS — D487 Neoplasm of uncertain behavior of other specified sites: Secondary | ICD-10-CM | POA: Diagnosis not present

## 2018-09-19 DIAGNOSIS — R64 Cachexia: Secondary | ICD-10-CM | POA: Diagnosis present

## 2018-09-19 DIAGNOSIS — G893 Neoplasm related pain (acute) (chronic): Secondary | ICD-10-CM | POA: Diagnosis present

## 2018-09-19 DIAGNOSIS — C7802 Secondary malignant neoplasm of left lung: Secondary | ICD-10-CM | POA: Diagnosis present

## 2018-09-19 DIAGNOSIS — M25552 Pain in left hip: Secondary | ICD-10-CM | POA: Diagnosis present

## 2018-09-19 DIAGNOSIS — I10 Essential (primary) hypertension: Secondary | ICD-10-CM | POA: Diagnosis present

## 2018-09-19 DIAGNOSIS — G4700 Insomnia, unspecified: Secondary | ICD-10-CM | POA: Diagnosis present

## 2018-09-19 DIAGNOSIS — E878 Other disorders of electrolyte and fluid balance, not elsewhere classified: Secondary | ICD-10-CM | POA: Diagnosis present

## 2018-09-19 DIAGNOSIS — D630 Anemia in neoplastic disease: Secondary | ICD-10-CM | POA: Diagnosis present

## 2018-09-19 DIAGNOSIS — E871 Hypo-osmolality and hyponatremia: Secondary | ICD-10-CM | POA: Diagnosis present

## 2018-09-19 DIAGNOSIS — J9601 Acute respiratory failure with hypoxia: Secondary | ICD-10-CM | POA: Diagnosis not present

## 2018-09-19 DIAGNOSIS — E43 Unspecified severe protein-calorie malnutrition: Secondary | ICD-10-CM | POA: Diagnosis present

## 2018-09-19 LAB — BASIC METABOLIC PANEL
Anion gap: 7 (ref 5–15)
BUN: 11 mg/dL (ref 6–20)
CHLORIDE: 96 mmol/L — AB (ref 98–111)
CO2: 25 mmol/L (ref 22–32)
Calcium: 8.3 mg/dL — ABNORMAL LOW (ref 8.9–10.3)
Creatinine, Ser: 0.77 mg/dL (ref 0.61–1.24)
GFR calc Af Amer: >60 mL/min (ref >60)
GFR calc non Af Amer: >60 mL/min (ref >60)
GLUCOSE: 116 mg/dL — AB (ref 70–99)
Potassium: 3.3 mmol/L — ABNORMAL LOW (ref 3.5–5.1)
Sodium: 128 mmol/L — ABNORMAL LOW (ref 135–145)

## 2018-09-19 LAB — CBC
HCT: 25.7 % — ABNORMAL LOW (ref 39.0–52.0)
Hemoglobin: 8.3 g/dL — ABNORMAL LOW (ref 13.0–17.0)
MCH: 26.5 pg (ref 26.0–34.0)
MCHC: 32.3 g/dL (ref 30.0–36.0)
MCV: 82.1 fL (ref 80.0–100.0)
Platelets: 127 10*3/uL — ABNORMAL LOW (ref 150–400)
RBC: 3.13 MIL/uL — ABNORMAL LOW (ref 4.22–5.81)
RDW: 16.8 % — ABNORMAL HIGH (ref 11.5–15.5)
WBC: 4.7 10*3/uL (ref 4.0–10.5)
nRBC: 0 % (ref 0.0–0.2)

## 2018-09-19 LAB — LIPASE, BLOOD: Lipase: 101 U/L — ABNORMAL HIGH (ref 11–51)

## 2018-09-19 LAB — PHOSPHORUS: Phosphorus: 3.2 mg/dL (ref 2.5–4.6)

## 2018-09-19 LAB — ABO/RH: ABO/RH(D): O POS

## 2018-09-19 LAB — PREALBUMIN: Prealbumin: 8.6 mg/dL — ABNORMAL LOW (ref 18–38)

## 2018-09-19 LAB — MAGNESIUM: Magnesium: 1.9 mg/dL (ref 1.7–2.4)

## 2018-09-19 MED ORDER — ONDANSETRON HCL 4 MG PO TABS
4.0000 mg | ORAL_TABLET | Freq: Four times a day (QID) | ORAL | Status: DC | PRN
  Administered 2018-09-20: 22:00:00 4 mg via ORAL
  Filled 2018-09-19: qty 1

## 2018-09-19 MED ORDER — ONDANSETRON HCL 4 MG/2ML IJ SOLN
4.0000 mg | Freq: Four times a day (QID) | INTRAMUSCULAR | Status: DC | PRN
  Administered 2018-09-19: 4 mg via INTRAVENOUS
  Filled 2018-09-19: qty 2

## 2018-09-19 MED ORDER — SODIUM CHLORIDE 0.9 % IV SOLN
INTRAVENOUS | Status: DC
  Administered 2018-09-19 – 2018-09-22 (×6): via INTRAVENOUS

## 2018-09-19 MED ORDER — POTASSIUM CHLORIDE CRYS ER 20 MEQ PO TBCR: 40.0000 meq | EXTENDED_RELEASE_TABLET | Freq: Two times a day (BID) | ORAL | Status: DC

## 2018-09-19 MED ORDER — SODIUM CHLORIDE 0.9 % IV SOLN
INTRAVENOUS | Status: AC
  Administered 2018-09-19: 05:00:00 via INTRAVENOUS

## 2018-09-19 MED ORDER — SENNOSIDES-DOCUSATE SODIUM 8.6-50 MG PO TABS: 1.0000 | ORAL_TABLET | Freq: Every evening | ORAL | Status: DC | PRN

## 2018-09-19 MED ORDER — GUAIFENESIN-CODEINE 100-10 MG/5ML PO SOLN
10.0000 mL | ORAL | Status: DC | PRN
  Administered 2018-09-19 – 2018-09-21 (×4): 10 mL via ORAL
  Filled 2018-09-19 (×4): qty 10

## 2018-09-19 MED ORDER — MORPHINE SULFATE (PF) 2 MG/ML IV SOLN
2.0000 mg | INTRAVENOUS | Status: DC | PRN
  Administered 2018-09-20: 13:00:00 2 mg via INTRAVENOUS
  Filled 2018-09-19: qty 1

## 2018-09-19 MED ORDER — MORPHINE SULFATE (PF) 4 MG/ML IV SOLN: 4.0000 mg | INTRAVENOUS | Status: DC | PRN

## 2018-09-19 MED ORDER — SODIUM CHLORIDE 0.9 % IV SOLN: INTRAVENOUS | Status: DC

## 2018-09-19 MED ORDER — ENOXAPARIN SODIUM 40 MG/0.4ML ~~LOC~~ SOLN: 40.0000 mg | SUBCUTANEOUS | Status: DC

## 2018-09-19 MED ORDER — POTASSIUM CHLORIDE CRYS ER 20 MEQ PO TBCR
40.0000 meq | EXTENDED_RELEASE_TABLET | Freq: Every day | ORAL | Status: AC
  Administered 2018-09-20: 40 meq via ORAL
  Filled 2018-09-19 (×2): qty 2

## 2018-09-19 MED ORDER — ACETAMINOPHEN 650 MG RE SUPP: 650.0000 mg | Freq: Four times a day (QID) | RECTAL | Status: DC | PRN

## 2018-09-19 MED ORDER — POTASSIUM CHLORIDE 10 MEQ/100ML IV SOLN
10.0000 meq | INTRAVENOUS | Status: AC
  Filled 2018-09-19 (×4): qty 100

## 2018-09-19 MED ORDER — ACETAMINOPHEN 325 MG PO TABS
650.0000 mg | ORAL_TABLET | Freq: Four times a day (QID) | ORAL | Status: DC | PRN
  Administered 2018-09-19 – 2018-09-20 (×2): 650 mg via ORAL
  Filled 2018-09-19 (×2): qty 2

## 2018-09-19 MED ORDER — BISACODYL 5 MG PO TBEC: 5.0000 mg | DELAYED_RELEASE_TABLET | Freq: Every day | ORAL | Status: DC | PRN

## 2018-09-19 MED ORDER — PANTOPRAZOLE SODIUM 40 MG PO TBEC
40.0000 mg | DELAYED_RELEASE_TABLET | Freq: Every day | ORAL | Status: DC
  Administered 2018-09-19 – 2018-09-22 (×4): 40 mg via ORAL
  Filled 2018-09-19 (×4): qty 1

## 2018-09-19 NOTE — Progress Notes (Signed)
[facility]: Nicola Quesnell    MR#:  [id number]  DATE OF BIRTH:  October 02, 1960  SUBJECTIVE:  CHIEF COMPLAINT:   Chief Complaint  Patient presents with  . 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  Communicated with patient as well as sister and other family members present at bedside using the video interpreter in the emergency room.  Patient admitted to having 15 pound weight loss within the last 2 to 3 months.  Only presented to the emergency room with a few day history of cough. Denies any complaints at this time.  REVIEW OF SYSTEMS:  Review of Systems  Constitutional: Positive for weight loss. Negative for chills and fever.  HENT: Positive for hearing loss. Negative for congestion and ear discharge.   Eyes: Negative for blurred vision and double vision.  Respiratory: Positive for cough and shortness of breath.   Cardiovascular: Negative for chest pain and palpitations.  Gastrointestinal: Negative for abdominal pain, heartburn and vomiting.  Genitourinary: Negative for dysuria and urgency.  Musculoskeletal: Negative for myalgias and neck pain.  Skin: Negative for itching and rash.  Neurological: Negative for dizziness and headaches.  Psychiatric/Behavioral: Negative for depression and suicidal ideas.    DRUG ALLERGIES:  No Known Allergies VITALS:  Blood pressure 140/90, pulse 85, temperature 98.3 F (36.8 C), temperature source Oral, resp. rate 15, height 5\' 3"  (1.6 m), weight 51.3 kg, SpO2 95 %. PHYSICAL EXAMINATION:   Physical Exam  Constitutional: He is oriented to person, place, and time. He appears well-developed and well-nourished.  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  HENT:  Head: Normocephalic and atraumatic.  Eyes: Pupils are equal, round, and reactive to light. Conjunctivae and EOM are normal.  Neck: Normal range of motion. Neck supple.  Cardiovascular: Normal rate, regular rhythm and  normal heart sounds.  Respiratory: Effort normal and breath sounds normal. No respiratory distress.  GI: Soft. Bowel sounds are normal.  Musculoskeletal: Normal range of motion.        General: No edema.  Neurological: He is alert and oriented to person, place, and time.  Communicated with patient using the video interpreter.  Skin: Skin is warm and dry.   LABORATORY PANEL:  Male CBC Recent Labs  Lab 09/19/18 1219  WBC 4.7  HGB 8.3*  HCT 25.7*  PLT 127*   ------------------------------------------------------------------------------------------------------------------ Chemistries  Recent Labs  Lab 09/18/18 1806 09/19/18 0506 09/19/18 1219  NA 130*  --  128*  K 3.2*  --  3.3*  CL 93*  --  96*  CO2 27  --  25  GLUCOSE 172*  --  116*  BUN 16  --  11  CREATININE 0.88  --  0.77  CALCIUM 8.6*  --  8.3*  MG  --  1.9  --   AST 24  --   --   ALT 17  --   --   ALKPHOS 279*  --   --   BILITOT 0.6  --   --    RADIOLOGY:  Dg Chest 2 View  Result Date: 09/18/2018 CLINICAL DATA:  Cough. Weight loss, patient has been feeling poorly for 3 months. EXAM: CHEST - 2 VIEW COMPARISON:  Remote chest radiograph 04/07/2008 FINDINGS: Heart is normal in size. Diffuse peribronchial and interstitial thickening. Slight bilateral hilar prominence. No confluent airspace disease or radiographic findings of mass. Minimal fluid in the fissures without subpulmonic effusion. No acute osseous abnormalities  are seen. IMPRESSION: 1. Diffuse peribronchial interstitial thickening with diagnostic considerations of pulmonary edema or bronchitis. 2. Mild bilateral hilar prominence is nonspecific, may be vascular overlap versus adenopathy. Consider further evaluation with chest CT with contrast for further evaluation. 3. No radiographic findings of pneumonia or pulmonary mass. Electronically Signed   By: Keith Rake M.D.   On: 09/18/2018 22:39   Ct Chest W Contrast  Result Date: 09/18/2018 EXAM: CT CHEST,  ABDOMEN, AND PELVIS WITH CONTRAST TECHNIQUE: Multidetector CT imaging of the chest, abdomen and pelvis was performed following the standard protocol during bolus administration of intravenous contrast. CONTRAST:  46mL OMNIPAQUE IOHEXOL 300 MG/ML  SOLN COMPARISON:  Same day CXR FINDINGS: CT CHEST FINDINGS Cardiovascular: Conventional branch pattern of the great vessels. Nonaneurysmal thoracic aorta. No large central pulmonary embolus. Heart size is top normal without pericardial effusion or thickening. Mediastinum/Nodes: Mild thyromegaly with retroclavicular extension of the thyroid gland. No dominant mass. Midline patent trachea. Mainstem bronchi appear patent. Mild enlargement of mediastinal lymph nodes in the right upper paratracheal, prevascular, left lower paratracheal and bilateral hilar lymph node stations ranging in size from 10 mm through 12 mm. The CT appearance of the esophagus is unremarkable. Lungs/Pleura: Interstitial thickening likely representing interstitial edema with ill-defined patchy multilobar masslike bilateral airspace opacities and peribronchial thickening are identified bilaterally. The largest is noted in the superior segment of left lower lobe measuring approximately 2 x 2.4 x 1.8 cm with smaller ill-defined nodular opacities scattered throughout both lungs. No effusion or pneumothorax. Musculoskeletal: No chest wall mass or suspicious bone lesions identified. CT ABDOMEN PELVIS FINDINGS Hepatobiliary: Concerning ill-defined hypodense lesion in the posterior segment of right hepatic lobe measuring 4.1 x 3.7 x 4.7 cm in transverse by AP by craniocaudad suspicious for hepatic metastasis. No biliary dilatation. Nondistended gallbladder containing a 1.6 cm gallstone. Pancreas: Hypodense ill-defined mass measuring 4.8 x 4.5 x 4.4 cm centered within the body of the pancreas concerning for pancreatic carcinoma. No ductal dilatation. Patent portal and splenic veins. Spleen: Normal Adrenals/Urinary  Tract: Bilobed hypodense masslike abnormality in the left adrenal gland likely representing metastatic disease measuring at least 3.8 x 1.6 cm, series 2/49. Exophytic complex cystic mass arising lower pole of the left kidney measuring 4.5 x 3.6 x 5 cm with mural nodule along the caudal aspect measuring up to 1.2 x 1.5 x 1.8 cm. Hypodense lesions of the right kidney are more likely to represent simple cysts though some are too small to further characterize. The largest is in the lower pole posteriorly measuring 1.8 cm in diameter. Stomach/Bowel: The stomach is nondistended. Normal small bowel rotation. Slight fluid-filled distention of distal and mid small bowel loops without mechanical obstruction. Normal appendix, terminal and distal ileum. Moderate transmural thickening of the transverse colon with ill-defined masslike abnormality involving the distal transverse colon measuring 5.2 x 7.4 x 2.9 cm, series 2/59 with single wall thickness up to 2.4 cm. There are adjacent peritoneal masses concerning for peritoneal carcinomatosis measuring 1.7 cm in the left hemiabdomen, series 2/60 with smaller mesenteric nodules in the left upper quadrant measuring up to 0.8 cm and 1.4 cm. Mesenteric edema and mesenteric nodules are also noted further caudad within the left hemiabdomen, the largest measuring up to 2 cm, series 2/68. Smaller left pelvic mesenteric nodule measuring 0.8 cm is seen on the left. Vascular/Lymphatic: Mild aortoiliac atherosclerosis without aneurysm or dissection. No retroperitoneal or definite mesenteric adenopathy. Reproductive: Prostate is unremarkable. Other: No free air or free fluid. Musculoskeletal: Suspicious osteolytic appearance  of the right femoral head on axial image 107, series 2. A more circumscribed left femoral neck lesion measuring up to 1.5 cm with ill-defined right posterior iliac bone 2.1 cm lesion. IMPRESSION: Chest CT: 1. Ill-defined masslike opacities scattered throughout both lungs  with associated hilar and mediastinal lymphadenopathy concerning for metastatic disease. Given the masslike abnormalities noted within the abdomen involving the pancreas and transverse colon, suspect that these are more likely metastatic and not due to a primary pulmonary neoplasm. 2. Nonaneurysmal thoracic aorta. 3. No acute pulmonary embolus. CT AP: 1. Hypodense mass centered within the body of the pancreas measuring 4.8 x 4.5 x 4.4 cm concerning for pancreatic carcinoma. 2. Masslike abnormality of the distal transverse colon measuring 5.2 x 7.4 x 2.9 cm with single wall thickness up to 2.4 cm. No bowel obstruction. This is also concerning for colonic neoplasm. 3. Hypodense mass of the right hepatic lobe measuring 4.1 x 3.7 x 4.7 cm concerning for hepatic metastasis. 4. Cystic mass arising off the lower pole of the left kidney with solid nodular component measuring 4.5 x 3.6 x 5 cm with the mural nodule measuring 1.2 x 1.5 x 1.8 cm. Cystic renal cell carcinoma is not excluded. 5. Findings concerning for peritoneal carcinomatosis with mesenteric nodules as above described. No ascites however is identified. 6. 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 These results were called by telephone at the time of interpretation on 09/18/2018 at 11:58 pm to Dr. Mable Paris , who verbally acknowledged these results. Electronically Signed   By: Ashley Royalty M.D.   On: 09/18/2018 23:58   Ct Abdomen Pelvis W Contrast  Result Date: 09/18/2018 EXAM: CT CHEST, ABDOMEN, AND PELVIS WITH CONTRAST TECHNIQUE: Multidetector CT imaging of the chest, abdomen and pelvis was performed following the standard protocol during bolus administration of intravenous contrast. CONTRAST:  8mL OMNIPAQUE IOHEXOL 300 MG/ML  SOLN COMPARISON:  Same day CXR FINDINGS: CT CHEST FINDINGS Cardiovascular: Conventional branch pattern of the great vessels. Nonaneurysmal thoracic aorta. No large central pulmonary embolus. Heart size is top  normal without pericardial effusion or thickening. Mediastinum/Nodes: Mild thyromegaly with retroclavicular extension of the thyroid gland. No dominant mass. Midline patent trachea. Mainstem bronchi appear patent. Mild enlargement of mediastinal lymph nodes in the right upper paratracheal, prevascular, left lower paratracheal and bilateral hilar lymph node stations ranging in size from 10 mm through 12 mm. The CT appearance of the esophagus is unremarkable. Lungs/Pleura: Interstitial thickening likely representing interstitial edema with ill-defined patchy multilobar masslike bilateral airspace opacities and peribronchial thickening are identified bilaterally. The largest is noted in the superior segment of left lower lobe measuring approximately 2 x 2.4 x 1.8 cm with smaller ill-defined nodular opacities scattered throughout both lungs. No effusion or pneumothorax. Musculoskeletal: No chest wall mass or suspicious bone lesions identified. CT ABDOMEN PELVIS FINDINGS Hepatobiliary: Concerning ill-defined hypodense lesion in the posterior segment of right hepatic lobe measuring 4.1 x 3.7 x 4.7 cm in transverse by AP by craniocaudad suspicious for hepatic metastasis. No biliary dilatation. Nondistended gallbladder containing a 1.6 cm gallstone. Pancreas: Hypodense ill-defined mass measuring 4.8 x 4.5 x 4.4 cm centered within the body of the pancreas concerning for pancreatic carcinoma. No ductal dilatation. Patent portal and splenic veins. Spleen: Normal Adrenals/Urinary Tract: Bilobed hypodense masslike abnormality in the left adrenal gland likely representing metastatic disease measuring at least 3.8 x 1.6 cm, series 2/49. Exophytic complex cystic mass arising lower pole of the left kidney measuring 4.5 x 3.6 x  5 cm with mural nodule along the caudal aspect measuring up to 1.2 x 1.5 x 1.8 cm. Hypodense lesions of the right kidney are more likely to represent simple cysts though some are too small to further  characterize. The largest is in the lower pole posteriorly measuring 1.8 cm in diameter. Stomach/Bowel: The stomach is nondistended. Normal small bowel rotation. Slight fluid-filled distention of distal and mid small bowel loops without mechanical obstruction. Normal appendix, terminal and distal ileum. Moderate transmural thickening of the transverse colon with ill-defined masslike abnormality involving the distal transverse colon measuring 5.2 x 7.4 x 2.9 cm, series 2/59 with single wall thickness up to 2.4 cm. There are adjacent peritoneal masses concerning for peritoneal carcinomatosis measuring 1.7 cm in the left hemiabdomen, series 2/60 with smaller mesenteric nodules in the left upper quadrant measuring up to 0.8 cm and 1.4 cm. Mesenteric edema and mesenteric nodules are also noted further caudad within the left hemiabdomen, the largest measuring up to 2 cm, series 2/68. Smaller left pelvic mesenteric nodule measuring 0.8 cm is seen on the left. Vascular/Lymphatic: Mild aortoiliac atherosclerosis without aneurysm or dissection. No retroperitoneal or definite mesenteric adenopathy. Reproductive: Prostate is unremarkable. Other: No free air or free fluid. Musculoskeletal: Suspicious osteolytic appearance of the right femoral head on axial image 107, series 2. A more circumscribed left femoral neck lesion measuring up to 1.5 cm with ill-defined right posterior iliac bone 2.1 cm lesion. IMPRESSION: Chest CT: 1. Ill-defined masslike opacities scattered throughout both lungs with associated hilar and mediastinal lymphadenopathy concerning for metastatic disease. Given the masslike abnormalities noted within the abdomen involving the pancreas and transverse colon, suspect that these are more likely metastatic and not due to a primary pulmonary neoplasm. 2. Nonaneurysmal thoracic aorta. 3. No acute pulmonary embolus. CT AP: 1. Hypodense mass centered within the body of the pancreas measuring 4.8 x 4.5 x 4.4 cm  concerning for pancreatic carcinoma. 2. Masslike abnormality of the distal transverse colon measuring 5.2 x 7.4 x 2.9 cm with single wall thickness up to 2.4 cm. No bowel obstruction. This is also concerning for colonic neoplasm. 3. Hypodense mass of the right hepatic lobe measuring 4.1 x 3.7 x 4.7 cm concerning for hepatic metastasis. 4. Cystic mass arising off the lower pole of the left kidney with solid nodular component measuring 4.5 x 3.6 x 5 cm with the mural nodule measuring 1.2 x 1.5 x 1.8 cm. Cystic renal cell carcinoma is not excluded. 5. Findings concerning for peritoneal carcinomatosis with mesenteric nodules as above described. No ascites however is identified. 6. 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 These results were called by telephone at the time of interpretation on 09/18/2018 at 11:58 pm to Dr. Mable Paris , who verbally acknowledged these results. Electronically Signed   By: Ashley Royalty M.D.   On: 09/18/2018 23:58   ASSESSMENT AND PLAN:   1.  Bilateral  metastatic lung disease Patient presented to the emergency room with complaints of a few day history of cough. CT scan of the chest in the emergency room revealed ill-defined masslike opacities scattered throughout both lungs with associated hilar and mediastinal lymphadenopathy concerning for metastatic disease.  CT scan of the abdomen also revealed pancreatic lesions concerning for pancreatic carcinoma as well as right hepatic lobe mass and cystic mass in the left kidney with other findings concerning for peritoneal carcinomatosis. Patient seen by oncologist.  No family members present at the time patient was evaluated by oncologist.  The  video interpreter was not assessable at the time patient was seen by oncologist. I discussed plan of care with oncologist today. Oncologist plan to discuss further with the patient and family members tomorrow regarding goal of care and if they wish to proceed with biopsy for  definitive evaluation.  2.  Hyponatremia with sodium level of 128. May be related to underlying lung malignancy and dehydration.  Gentle IV fluid hydration. Hypokalemia; already replaced.  Follow-up on repeat levels in a.m.  3.  Anemia Likely due to anemia of chronic disease.  Follow-up on repeat levels in a.m.  -DVT PPx: Lovenox. -Code status: Full code.  All the records are reviewed and case discussed with Care Management/Social Worker. Management plans discussed with the patient, family and they are in agreement.  CODE STATUS: Full Code  TOTAL TIME TAKING CARE OF THIS PATIENT: 33 minutes.   More than 50% of the time was spent in counseling/coordination of care: YES  POSSIBLE D/C IN 3 DAYS, DEPENDING ON CLINICAL CONDITION.   Kloie Whiting M.D on 09/19/2018 at 4:35 PM  Between 7am to 6pm - Pager - [phone]  After 6pm go to [email or web address] - Proofreader  [facility]  Office  [phone]  CC: Primary care physician; Patient, No Pcp Per  Note: This dictation was prepared with Dragon dictation along with smaller phrase technology. Any transcriptional errors that result from this process are unintentional.

## 2018-09-19 NOTE — ED Notes (Signed)
Admitting team at bedside.

## 2018-09-19 NOTE — Assessment & Plan Note (Addendum)
#   59 year old male patient history of alcohol/smoking-currently admitted to the hospital cough/shortness of breath-on CT scan noted to have multiple lesions concerning for metastatic disease  #Pancreatic head mass/colonic mass/liver lesion/adrenal lesion/mesenteric adenopathy/lung lesions/bilateral femoral lesion/cystic renal mass-primary malignancy either pancreatic versus colonic versus synchronous.  Patient currently status post omental biopsy; CA-19-9 elevated/CEA slightly elevated.  #Anemia-hemoglobin around 8-stable no obvious iron deficiency noted likely anemia of chronic disease; hemoglobin 7.3; if lower will plan blood transfusion next week.  #Cough/difficulty breathing-currently oxygen.  Suspect COPD.  Recommend Advair/albuterol discussed with Dr. Bridgett Larsson.  #Significant family history of cancer father mother sister-we will likely need genetic testing/however await above biopsy.  #Mild thrombocytopenia-alcohol versus others.  Monitor for now.  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  We will plan bisphosphonates at next visit.  #Discussed that we will plan outpatient follow-up on January 15 at 830 at the cancer center; appointments confirm with the patient's brother Ovid Curd over the phone.
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
## 2018-09-19 NOTE — ED Notes (Signed)
Pt provided with pillows and a warm blanket. Lights adjusted for pt comfort. Call bell within reach. Awaits bed upstairs at this time.

## 2018-09-19 NOTE — H&P (Signed)
[facility]: Eugene Burton    MR#:  [id number]  DATE OF BIRTH:  [birth year]  DATE OF ADMISSION:  09/18/2018  PRIMARY CARE PHYSICIAN: Patient, No Pcp Per   REQUESTING/REFERRING PHYSICIAN: Merlyn Lot, MD  CHIEF COMPLAINT:   Chief Complaint  Patient presents with  . Abnormal Lab    HISTORY OF PRESENT ILLNESS:  Eugene Burton  is a 58 y.o. male with a known history of HTN, GERD, Hx tobacco use D/O, Hx EtOH abuse p/w cough/URI symptoms, N/V, PO intolerance, insomnia. 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 Hx obtained w/ video interpreter. Pt endorses 41mo Hx URI symptoms, predominantly cough. He endorses coughing spells so severe that he has recurrent post-tussive emesis. He also endorses some nausea/vomiting outside of cough-triggered symptoms. He endorses poor PO intake and unintentional weight loss. He endorses insomnia, and states he tosses and turns all night, but does not know why. He denies chest pain, abdominal pain or any other pain symptoms. He states he feels reasonably well, and is otherwise w/o complaint. He states he used to drink and smoke heavily, but does not anymore.  PAST MEDICAL HISTORY:   Past Medical History:  Diagnosis Date  . Deaf    patient needs interpreter   . GERD (gastroesophageal reflux disease)   . Hypertension     PAST SURGICAL HISTORY:   Past Surgical History:  Procedure Laterality Date  . broken leg    . OPEN REDUCTION INTERNAL FIXATION (ORIF) CALCANEAL FRACTURE WITH FUSION Right    right lower leg surgery s/p fracture    SOCIAL HISTORY:   Social History   Tobacco Use  . Smoking status: Current Some Day Smoker    Packs/day: 1.00    Types: Cigarettes  . Smokeless tobacco: Never Used  . Tobacco comment: materials given to patient as he is "slightly" interested in quittig  Substance Use Topics  . Alcohol use: No    FAMILY HISTORY:   Family History  Problem  Relation Age of Onset  . Cancer Sister   . Cancer Brother        skin cancer    DRUG ALLERGIES:  No Known Allergies  REVIEW OF SYSTEMS:   Review of Systems  Constitutional: Positive for weight loss.  HENT: Negative for congestion, ear pain, hearing loss, nosebleeds, sinus pain, sore throat and tinnitus.   Eyes: Negative for blurred vision, double vision and photophobia.  Respiratory: Positive for cough and shortness of breath.   Cardiovascular: Negative for chest pain.  Gastrointestinal: Positive for nausea and vomiting. Negative for abdominal pain.  Genitourinary: Negative for dysuria.  Musculoskeletal: Negative for myalgias.  Skin: Negative for itching and rash.  Neurological: Positive for weakness. Negative for dizziness.  Psychiatric/Behavioral: The patient has insomnia.    MEDICATIONS AT HOME:   Prior to Admission medications   Medication Sig Start Date End Date Taking? Authorizing Provider  benzonatate (TESSALON PERLES) 100 MG capsule Take 1 capsule (100 mg total) by mouth 3 (three) times daily as needed for cough. 08/03/18  Yes Iloabachie, Chioma E, NP  losartan-hydrochlorothiazide (HYZAAR) 50-12.5 MG tablet Take 1 tablet by mouth daily. 08/03/18  Yes Iloabachie, Chioma E, NP  ranitidine (ZANTAC) 150 MG tablet Take 1 tablet (150 mg total) by mouth 2 (two) times daily. 08/03/18  Yes Iloabachie, Chioma E, NP  nebivolol (BYSTOLIC) 5 MG tablet Take 1 tablet (5 mg total) by mouth daily. Patient not taking: Reported on 09/19/2018 08/03/18  Iloabachie, Chioma E, NP      VITAL SIGNS:  Blood pressure (!) 140/92, pulse 82, temperature 98.4 F (36.9 C), temperature source Oral, resp. rate 16, height 5\' 3"  (1.6 m), weight 51.3 kg, SpO2 95 %.  PHYSICAL EXAMINATION:  Physical Exam Constitutional:      General: He is not in acute distress.    Appearance: Normal appearance. He is ill-appearing. He is not toxic-appearing or diaphoretic.  HENT:     Head: Atraumatic.  Eyes:      General: No scleral icterus.    Extraocular Movements: Extraocular movements intact.     Conjunctiva/sclera: Conjunctivae normal.  Neck:     Musculoskeletal: Neck supple.  Cardiovascular:     Rate and Rhythm: Normal rate and regular rhythm.     Heart sounds: Normal heart sounds. No murmur. No friction rub. No gallop.   Pulmonary:     Effort: No respiratory distress.     Breath sounds: Normal breath sounds. No stridor. No wheezing, rhonchi or rales.  Abdominal:     General: Bowel sounds are normal. There is no distension.     Palpations: Abdomen is soft.     Tenderness: There is no abdominal tenderness. There is no guarding or rebound.  Musculoskeletal: Normal range of motion.        General: No swelling or tenderness.     Right lower leg: No edema.     Left lower leg: No edema.  Lymphadenopathy:     Cervical: No cervical adenopathy.  Skin:    General: Skin is warm and dry.     Findings: No erythema or rash.  Neurological:     General: No focal deficit present.     Mental Status: He is alert. Mental status is at baseline.  Psychiatric:        Mood and Affect: Mood normal.        Behavior: Behavior normal.        Thought Content: Thought content normal.        Judgment: Judgment normal.    LABORATORY PANEL:   CBC Recent Labs  Lab 09/18/18 1806  WBC 8.5  HGB 9.6*  HCT 29.1*  PLT 191   ------------------------------------------------------------------------------------------------------------------  Chemistries  Recent Labs  Lab 09/18/18 1806 09/19/18 0506  NA 130*  --   K 3.2*  --   CL 93*  --   CO2 27  --   GLUCOSE 172*  --   BUN 16  --   CREATININE 0.88  --   CALCIUM 8.6*  --   MG  --  1.9  AST 24  --   ALT 17  --   ALKPHOS 279*  --   BILITOT 0.6  --    ------------------------------------------------------------------------------------------------------------------  Cardiac Enzymes No results for input(s): TROPONINI in the last 168  hours. ------------------------------------------------------------------------------------------------------------------  RADIOLOGY:  Dg Chest 2 View  Result Date: 09/18/2018 CLINICAL DATA:  Cough. Weight loss, patient has been feeling poorly for 3 months. EXAM: CHEST - 2 VIEW COMPARISON:  Remote chest radiograph 04/07/2008 FINDINGS: Heart is normal in size. Diffuse peribronchial and interstitial thickening. Slight bilateral hilar prominence. No confluent airspace disease or radiographic findings of mass. Minimal fluid in the fissures without subpulmonic effusion. No acute osseous abnormalities are seen. IMPRESSION: 1. Diffuse peribronchial interstitial thickening with diagnostic considerations of pulmonary edema or bronchitis. 2. Mild bilateral hilar prominence is nonspecific, may be vascular overlap versus adenopathy. Consider further evaluation with chest CT with contrast for further evaluation. 3. No  radiographic findings of pneumonia or pulmonary mass. Electronically Signed   By: Keith Rake M.D.   On: 09/18/2018 22:39   Ct Chest W Contrast  Result Date: 09/18/2018 EXAM: CT CHEST, ABDOMEN, AND PELVIS WITH CONTRAST TECHNIQUE: Multidetector CT imaging of the chest, abdomen and pelvis was performed following the standard protocol during bolus administration of intravenous contrast. CONTRAST:  79mL OMNIPAQUE IOHEXOL 300 MG/ML  SOLN COMPARISON:  Same day CXR FINDINGS: CT CHEST FINDINGS Cardiovascular: Conventional branch pattern of the great vessels. Nonaneurysmal thoracic aorta. No large central pulmonary embolus. Heart size is top normal without pericardial effusion or thickening. Mediastinum/Nodes: Mild thyromegaly with retroclavicular extension of the thyroid gland. No dominant mass. Midline patent trachea. Mainstem bronchi appear patent. Mild enlargement of mediastinal lymph nodes in the right upper paratracheal, prevascular, left lower paratracheal and bilateral hilar lymph node stations ranging  in size from 10 mm through 12 mm. The CT appearance of the esophagus is unremarkable. Lungs/Pleura: Interstitial thickening likely representing interstitial edema with ill-defined patchy multilobar masslike bilateral airspace opacities and peribronchial thickening are identified bilaterally. The largest is noted in the superior segment of left lower lobe measuring approximately 2 x 2.4 x 1.8 cm with smaller ill-defined nodular opacities scattered throughout both lungs. No effusion or pneumothorax. Musculoskeletal: No chest wall mass or suspicious bone lesions identified. CT ABDOMEN PELVIS FINDINGS Hepatobiliary: Concerning ill-defined hypodense lesion in the posterior segment of right hepatic lobe measuring 4.1 x 3.7 x 4.7 cm in transverse by AP by craniocaudad suspicious for hepatic metastasis. No biliary dilatation. Nondistended gallbladder containing a 1.6 cm gallstone. Pancreas: Hypodense ill-defined mass measuring 4.8 x 4.5 x 4.4 cm centered within the body of the pancreas concerning for pancreatic carcinoma. No ductal dilatation. Patent portal and splenic veins. Spleen: Normal Adrenals/Urinary Tract: Bilobed hypodense masslike abnormality in the left adrenal gland likely representing metastatic disease measuring at least 3.8 x 1.6 cm, series 2/49. Exophytic complex cystic mass arising lower pole of the left kidney measuring 4.5 x 3.6 x 5 cm with mural nodule along the caudal aspect measuring up to 1.2 x 1.5 x 1.8 cm. Hypodense lesions of the right kidney are more likely to represent simple cysts though some are too small to further characterize. The largest is in the lower pole posteriorly measuring 1.8 cm in diameter. Stomach/Bowel: The stomach is nondistended. Normal small bowel rotation. Slight fluid-filled distention of distal and mid small bowel loops without mechanical obstruction. Normal appendix, terminal and distal ileum. Moderate transmural thickening of the transverse colon with ill-defined masslike  abnormality involving the distal transverse colon measuring 5.2 x 7.4 x 2.9 cm, series 2/59 with single wall thickness up to 2.4 cm. There are adjacent peritoneal masses concerning for peritoneal carcinomatosis measuring 1.7 cm in the left hemiabdomen, series 2/60 with smaller mesenteric nodules in the left upper quadrant measuring up to 0.8 cm and 1.4 cm. Mesenteric edema and mesenteric nodules are also noted further caudad within the left hemiabdomen, the largest measuring up to 2 cm, series 2/68. Smaller left pelvic mesenteric nodule measuring 0.8 cm is seen on the left. Vascular/Lymphatic: Mild aortoiliac atherosclerosis without aneurysm or dissection. No retroperitoneal or definite mesenteric adenopathy. Reproductive: Prostate is unremarkable. Other: No free air or free fluid. Musculoskeletal: Suspicious osteolytic appearance of the right femoral head on axial image 107, series 2. A more circumscribed left femoral neck lesion measuring up to 1.5 cm with ill-defined right posterior iliac bone 2.1 cm lesion. IMPRESSION: Chest CT: 1. Ill-defined masslike opacities scattered throughout both  lungs with associated hilar and mediastinal lymphadenopathy concerning for metastatic disease. Given the masslike abnormalities noted within the abdomen involving the pancreas and transverse colon, suspect that these are more likely metastatic and not due to a primary pulmonary neoplasm. 2. Nonaneurysmal thoracic aorta. 3. No acute pulmonary embolus. CT AP: 1. Hypodense mass centered within the body of the pancreas measuring 4.8 x 4.5 x 4.4 cm concerning for pancreatic carcinoma. 2. Masslike abnormality of the distal transverse colon measuring 5.2 x 7.4 x 2.9 cm with single wall thickness up to 2.4 cm. No bowel obstruction. This is also concerning for colonic neoplasm. 3. Hypodense mass of the right hepatic lobe measuring 4.1 x 3.7 x 4.7 cm concerning for hepatic metastasis. 4. Cystic mass arising off the lower pole of the left  kidney with solid nodular component measuring 4.5 x 3.6 x 5 cm with the mural nodule measuring 1.2 x 1.5 x 1.8 cm. Cystic renal cell carcinoma is not excluded. 5. Findings concerning for peritoneal carcinomatosis with mesenteric nodules as above described. No ascites however is identified. 6. 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 These results were called by telephone at the time of interpretation on 09/18/2018 at 11:58 pm to Dr. Mable Paris , who verbally acknowledged these results. Electronically Signed   By: Ashley Royalty M.D.   On: 09/18/2018 23:58   Ct Abdomen Pelvis W Contrast  Result Date: 09/18/2018 EXAM: CT CHEST, ABDOMEN, AND PELVIS WITH CONTRAST TECHNIQUE: Multidetector CT imaging of the chest, abdomen and pelvis was performed following the standard protocol during bolus administration of intravenous contrast. CONTRAST:  67mL OMNIPAQUE IOHEXOL 300 MG/ML  SOLN COMPARISON:  Same day CXR FINDINGS: CT CHEST FINDINGS Cardiovascular: Conventional branch pattern of the great vessels. Nonaneurysmal thoracic aorta. No large central pulmonary embolus. Heart size is top normal without pericardial effusion or thickening. Mediastinum/Nodes: Mild thyromegaly with retroclavicular extension of the thyroid gland. No dominant mass. Midline patent trachea. Mainstem bronchi appear patent. Mild enlargement of mediastinal lymph nodes in the right upper paratracheal, prevascular, left lower paratracheal and bilateral hilar lymph node stations ranging in size from 10 mm through 12 mm. The CT appearance of the esophagus is unremarkable. Lungs/Pleura: Interstitial thickening likely representing interstitial edema with ill-defined patchy multilobar masslike bilateral airspace opacities and peribronchial thickening are identified bilaterally. The largest is noted in the superior segment of left lower lobe measuring approximately 2 x 2.4 x 1.8 cm with smaller ill-defined nodular opacities scattered  throughout both lungs. No effusion or pneumothorax. Musculoskeletal: No chest wall mass or suspicious bone lesions identified. CT ABDOMEN PELVIS FINDINGS Hepatobiliary: Concerning ill-defined hypodense lesion in the posterior segment of right hepatic lobe measuring 4.1 x 3.7 x 4.7 cm in transverse by AP by craniocaudad suspicious for hepatic metastasis. No biliary dilatation. Nondistended gallbladder containing a 1.6 cm gallstone. Pancreas: Hypodense ill-defined mass measuring 4.8 x 4.5 x 4.4 cm centered within the body of the pancreas concerning for pancreatic carcinoma. No ductal dilatation. Patent portal and splenic veins. Spleen: Normal Adrenals/Urinary Tract: Bilobed hypodense masslike abnormality in the left adrenal gland likely representing metastatic disease measuring at least 3.8 x 1.6 cm, series 2/49. Exophytic complex cystic mass arising lower pole of the left kidney measuring 4.5 x 3.6 x 5 cm with mural nodule along the caudal aspect measuring up to 1.2 x 1.5 x 1.8 cm. Hypodense lesions of the right kidney are more likely to represent simple cysts though some are too small to further characterize. The largest is  in the lower pole posteriorly measuring 1.8 cm in diameter. Stomach/Bowel: The stomach is nondistended. Normal small bowel rotation. Slight fluid-filled distention of distal and mid small bowel loops without mechanical obstruction. Normal appendix, terminal and distal ileum. Moderate transmural thickening of the transverse colon with ill-defined masslike abnormality involving the distal transverse colon measuring 5.2 x 7.4 x 2.9 cm, series 2/59 with single wall thickness up to 2.4 cm. There are adjacent peritoneal masses concerning for peritoneal carcinomatosis measuring 1.7 cm in the left hemiabdomen, series 2/60 with smaller mesenteric nodules in the left upper quadrant measuring up to 0.8 cm and 1.4 cm. Mesenteric edema and mesenteric nodules are also noted further caudad within the left  hemiabdomen, the largest measuring up to 2 cm, series 2/68. Smaller left pelvic mesenteric nodule measuring 0.8 cm is seen on the left. Vascular/Lymphatic: Mild aortoiliac atherosclerosis without aneurysm or dissection. No retroperitoneal or definite mesenteric adenopathy. Reproductive: Prostate is unremarkable. Other: No free air or free fluid. Musculoskeletal: Suspicious osteolytic appearance of the right femoral head on axial image 107, series 2. A more circumscribed left femoral neck lesion measuring up to 1.5 cm with ill-defined right posterior iliac bone 2.1 cm lesion. IMPRESSION: Chest CT: 1. Ill-defined masslike opacities scattered throughout both lungs with associated hilar and mediastinal lymphadenopathy concerning for metastatic disease. Given the masslike abnormalities noted within the abdomen involving the pancreas and transverse colon, suspect that these are more likely metastatic and not due to a primary pulmonary neoplasm. 2. Nonaneurysmal thoracic aorta. 3. No acute pulmonary embolus. CT AP: 1. Hypodense mass centered within the body of the pancreas measuring 4.8 x 4.5 x 4.4 cm concerning for pancreatic carcinoma. 2. Masslike abnormality of the distal transverse colon measuring 5.2 x 7.4 x 2.9 cm with single wall thickness up to 2.4 cm. No bowel obstruction. This is also concerning for colonic neoplasm. 3. Hypodense mass of the right hepatic lobe measuring 4.1 x 3.7 x 4.7 cm concerning for hepatic metastasis. 4. Cystic mass arising off the lower pole of the left kidney with solid nodular component measuring 4.5 x 3.6 x 5 cm with the mural nodule measuring 1.2 x 1.5 x 1.8 cm. Cystic renal cell carcinoma is not excluded. 5. Findings concerning for peritoneal carcinomatosis with mesenteric nodules as above described. No ascites however is identified. 6. 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 These results were called by telephone at the time of interpretation on 09/18/2018  at 11:58 pm to Dr. Mable Paris , who verbally acknowledged these results. Electronically Signed   By: Ashley Royalty M.D.   On: 09/18/2018 23:58   IMPRESSION AND PLAN:   A/P: 33M w/ PMHx HTN, GERD, Hx tobacco use D/O, Hx EtOH abuse p/w cough/URI symptoms, N/V, PO intolerance, insomnia. CT C/A/P (+) findings suggestive of metastatic Ca. Hyponatremia, hypokalemia, hypochloremia, hyperglycemia, hypocalcemia, hypoalbuminemia, lipase elevation, hypoproteinemia, normocytic anemia. -Cough/URI symptoms, N/V, PO intolerance, insomnia, possible metastatic Ca, lipase elevation: IVF. Symptomatic mgmt, antiemetics, pain ctrl. Oncology consult. CA 19-9. -Hyponatremia, hypokalemia, hypochloremia, hypocalcemia, hypoalbuminemia, hypoproteinemia, normocytic anemia: IVF. Replete K+, monitor. Mag level WNL. Ionized calcium. Prealbumin. Normocytic anemia likely 2/2 chronic disease, unDx Ca. -c/w home meds/formulary subs. -FEN/GI: Regular diet as tolerated. -DVT PPx: Lovenox. -Code status: Full code. -Disposition: Admission, > 2 midnights.   All the records are reviewed and case discussed with ED provider. Management plans discussed with the patient, family and they are in agreement.  CODE STATUS: Full code.  TOTAL TIME TAKING CARE OF THIS PATIENT:  75 minutes.    Arta Silence M.D on 09/19/2018 at 8:42 AM  Between 7am to 6pm - Pager - [phone]  After 6pm go to [email or web address] - Proofreader  [facility]  Office  [phone]  CC: Primary care physician; Patient, No Pcp Per   Note: This dictation was prepared with Dragon dictation along with smaller phrase technology. Any transcriptional errors that result from this process are unintentional.

## 2018-09-19 NOTE — ED Notes (Signed)
Pt updated by provider of results via electronic interpreter.

## 2018-09-20 ENCOUNTER — Other Ambulatory Visit: Payer: Self-pay

## 2018-09-20 DIAGNOSIS — M25551 Pain in right hip: Secondary | ICD-10-CM

## 2018-09-20 DIAGNOSIS — M25552 Pain in left hip: Secondary | ICD-10-CM

## 2018-09-20 LAB — PHOSPHORUS: Phosphorus: 2.9 mg/dL (ref 2.5–4.6)

## 2018-09-20 LAB — BASIC METABOLIC PANEL
Anion gap: 8 (ref 5–15)
BUN: 9 mg/dL (ref 6–20)
CO2: 23 mmol/L (ref 22–32)
Calcium: 8.4 mg/dL — ABNORMAL LOW (ref 8.9–10.3)
Chloride: 101 mmol/L (ref 98–111)
Creatinine, Ser: 0.7 mg/dL (ref 0.61–1.24)
Glucose, Bld: 123 mg/dL — ABNORMAL HIGH (ref 70–99)
Potassium: 3.4 mmol/L — ABNORMAL LOW (ref 3.5–5.1)
Sodium: 132 mmol/L — ABNORMAL LOW (ref 135–145)

## 2018-09-20 LAB — PROTIME-INR
INR: 1.23
Prothrombin Time: 15.4 seconds — ABNORMAL HIGH (ref 11.4–15.2)

## 2018-09-20 LAB — CA 19-9 (SERIAL): CAN 19-9: 3933 U/mL — AB (ref 0–35)

## 2018-09-20 LAB — APTT: aPTT: 41 seconds — ABNORMAL HIGH (ref 24–36)

## 2018-09-20 LAB — CBC
HCT: 26.3 % — ABNORMAL LOW (ref 39.0–52.0)
Hemoglobin: 8.6 g/dL — ABNORMAL LOW (ref 13.0–17.0)
MCH: 26.7 pg (ref 26.0–34.0)
MCHC: 32.7 g/dL (ref 30.0–36.0)
MCV: 81.7 fL (ref 80.0–100.0)
NRBC: 0 % (ref 0.0–0.2)
Platelets: 143 10*3/uL — ABNORMAL LOW (ref 150–400)
RBC: 3.22 MIL/uL — ABNORMAL LOW (ref 4.22–5.81)
RDW: 16.5 % — ABNORMAL HIGH (ref 11.5–15.5)
WBC: 6.8 10*3/uL (ref 4.0–10.5)

## 2018-09-20 LAB — IRON AND TIBC
Iron: 24 ug/dL — ABNORMAL LOW (ref 45–182)
Saturation Ratios: 12 % — ABNORMAL LOW (ref 17.9–39.5)
TIBC: 198 ug/dL — ABNORMAL LOW (ref 250–450)
UIBC: 174 ug/dL

## 2018-09-20 LAB — CALCIUM, IONIZED: Calcium, Ionized, Serum: 5.1 mg/dL (ref 4.5–5.6)

## 2018-09-20 LAB — FERRITIN: Ferritin: 332 ng/mL (ref 24–336)

## 2018-09-20 LAB — HIV ANTIBODY (ROUTINE TESTING W REFLEX): HIV Screen 4th Generation wRfx: NONREACTIVE

## 2018-09-20 LAB — MAGNESIUM: Magnesium: 1.6 mg/dL — ABNORMAL LOW (ref 1.7–2.4)

## 2018-09-20 LAB — LACTATE DEHYDROGENASE: LDH: 194 U/L — ABNORMAL HIGH (ref 98–192)

## 2018-09-20 MED ORDER — MAGNESIUM OXIDE 400 (241.3 MG) MG PO TABS
800.0000 mg | ORAL_TABLET | Freq: Once | ORAL | Status: AC
  Administered 2018-09-20: 09:00:00 800 mg via ORAL
  Filled 2018-09-20: qty 2

## 2018-09-20 NOTE — Progress Notes (Signed)
[facility]   DOB:05-27-1961   KZ#:[id number]    Subjective: Interaction with the patient through sign language interpreter.   Patient did not have a good night sleep because of cough/abdominal pain/bilateral hip pain.  Patient has not received any pain medication.  Appetite is fair at best.   Objective:  Vitals:   09/20/18 0413 09/20/18 0843  BP: (!) 117/59 117/86  Pulse: (!) 117 (!) 118  Resp: 17 14  Temp: 98.2 F (36.8 C) 98 F (36.7 C)  SpO2: (!) 88% 99%     Intake/Output Summary (Last 24 hours) at 09/20/2018 1321 Last data filed at 09/20/2018 0545 Gross per 24 hour  Intake 431.11 ml  Output -  Net 431.11 ml    Physical Exam  Constitutional: He is oriented to person, place, and time.  Thin built cachectic male patient.  Resting comfortably.  HENT:  Head: Normocephalic and atraumatic.  Mouth/Throat: Oropharynx is clear and moist. No oropharyngeal exudate.  Eyes: Pupils are equal, round, and reactive to light.  Neck: Normal range of motion. Neck supple.  Cardiovascular: Normal rate and regular rhythm.  Pulmonary/Chest: No respiratory distress. He has no wheezes.  Decreased air entry.  Abdominal: Soft. Bowel sounds are normal. He exhibits no distension and no mass. There is no abdominal tenderness. There is no rebound and no guarding.  Musculoskeletal: Normal range of motion.        General: No tenderness or edema.  Neurological: He is alert and oriented to person, place, and time.  Skin: Skin is warm.  Psychiatric: Affect normal.     Labs:  Lab Results  Component Value Date   WBC 6.8 09/20/2018   HGB 8.6 (L) 09/20/2018   HCT 26.3 (L) 09/20/2018   MCV 81.7 09/20/2018   PLT 143 (L) 09/20/2018   NEUTROABS 5.5 06/01/2018    Lab Results  Component Value Date   NA 132 (L) 09/20/2018   K 3.4 (L) 09/20/2018   CL 101 09/20/2018   CO2 23 09/20/2018    Studies:  Dg Chest 2 View  Result Date: 09/18/2018 CLINICAL DATA:  Cough. Weight loss, patient has been  feeling poorly for 3 months. EXAM: CHEST - 2 VIEW COMPARISON:  Remote chest radiograph 04/07/2008 FINDINGS: Heart is normal in size. Diffuse peribronchial and interstitial thickening. Slight bilateral hilar prominence. No confluent airspace disease or radiographic findings of mass. Minimal fluid in the fissures without subpulmonic effusion. No acute osseous abnormalities are seen. IMPRESSION: 1. Diffuse peribronchial interstitial thickening with diagnostic considerations of pulmonary edema or bronchitis. 2. Mild bilateral hilar prominence is nonspecific, may be vascular overlap versus adenopathy. Consider further evaluation with chest CT with contrast for further evaluation. 3. No radiographic findings of pneumonia or pulmonary mass. Electronically Signed   By: Keith Rake M.D.   On: 09/18/2018 22:39   Ct Chest W Contrast  Result Date: 09/18/2018 EXAM: CT CHEST, ABDOMEN, AND PELVIS WITH CONTRAST TECHNIQUE: Multidetector CT imaging of the chest, abdomen and pelvis was performed following the standard protocol during bolus administration of intravenous contrast. CONTRAST:  33mL OMNIPAQUE IOHEXOL 300 MG/ML  SOLN COMPARISON:  Same day CXR FINDINGS: CT CHEST FINDINGS Cardiovascular: Conventional branch pattern of the great vessels. Nonaneurysmal thoracic aorta. No large central pulmonary embolus. Heart size is top normal without pericardial effusion or thickening. Mediastinum/Nodes: Mild thyromegaly with retroclavicular extension of the thyroid gland. No dominant mass. Midline patent trachea. Mainstem bronchi appear patent. Mild enlargement of mediastinal lymph nodes in the right upper paratracheal, prevascular, left  lower paratracheal and bilateral hilar lymph node stations ranging in size from 10 mm through 12 mm. The CT appearance of the esophagus is unremarkable. Lungs/Pleura: Interstitial thickening likely representing interstitial edema with ill-defined patchy multilobar masslike bilateral airspace  opacities and peribronchial thickening are identified bilaterally. The largest is noted in the superior segment of left lower lobe measuring approximately 2 x 2.4 x 1.8 cm with smaller ill-defined nodular opacities scattered throughout both lungs. No effusion or pneumothorax. Musculoskeletal: No chest wall mass or suspicious bone lesions identified. CT ABDOMEN PELVIS FINDINGS Hepatobiliary: Concerning ill-defined hypodense lesion in the posterior segment of right hepatic lobe measuring 4.1 x 3.7 x 4.7 cm in transverse by AP by craniocaudad suspicious for hepatic metastasis. No biliary dilatation. Nondistended gallbladder containing a 1.6 cm gallstone. Pancreas: Hypodense ill-defined mass measuring 4.8 x 4.5 x 4.4 cm centered within the body of the pancreas concerning for pancreatic carcinoma. No ductal dilatation. Patent portal and splenic veins. Spleen: Normal Adrenals/Urinary Tract: Bilobed hypodense masslike abnormality in the left adrenal gland likely representing metastatic disease measuring at least 3.8 x 1.6 cm, series 2/49. Exophytic complex cystic mass arising lower pole of the left kidney measuring 4.5 x 3.6 x 5 cm with mural nodule along the caudal aspect measuring up to 1.2 x 1.5 x 1.8 cm. Hypodense lesions of the right kidney are more likely to represent simple cysts though some are too small to further characterize. The largest is in the lower pole posteriorly measuring 1.8 cm in diameter. Stomach/Bowel: The stomach is nondistended. Normal small bowel rotation. Slight fluid-filled distention of distal and mid small bowel loops without mechanical obstruction. Normal appendix, terminal and distal ileum. Moderate transmural thickening of the transverse colon with ill-defined masslike abnormality involving the distal transverse colon measuring 5.2 x 7.4 x 2.9 cm, series 2/59 with single wall thickness up to 2.4 cm. There are adjacent peritoneal masses concerning for peritoneal carcinomatosis measuring 1.7  cm in the left hemiabdomen, series 2/60 with smaller mesenteric nodules in the left upper quadrant measuring up to 0.8 cm and 1.4 cm. Mesenteric edema and mesenteric nodules are also noted further caudad within the left hemiabdomen, the largest measuring up to 2 cm, series 2/68. Smaller left pelvic mesenteric nodule measuring 0.8 cm is seen on the left. Vascular/Lymphatic: Mild aortoiliac atherosclerosis without aneurysm or dissection. No retroperitoneal or definite mesenteric adenopathy. Reproductive: Prostate is unremarkable. Other: No free air or free fluid. Musculoskeletal: Suspicious osteolytic appearance of the right femoral head on axial image 107, series 2. A more circumscribed left femoral neck lesion measuring up to 1.5 cm with ill-defined right posterior iliac bone 2.1 cm lesion. IMPRESSION: Chest CT: 1. Ill-defined masslike opacities scattered throughout both lungs with associated hilar and mediastinal lymphadenopathy concerning for metastatic disease. Given the masslike abnormalities noted within the abdomen involving the pancreas and transverse colon, suspect that these are more likely metastatic and not due to a primary pulmonary neoplasm. 2. Nonaneurysmal thoracic aorta. 3. No acute pulmonary embolus. CT AP: 1. Hypodense mass centered within the body of the pancreas measuring 4.8 x 4.5 x 4.4 cm concerning for pancreatic carcinoma. 2. Masslike abnormality of the distal transverse colon measuring 5.2 x 7.4 x 2.9 cm with single wall thickness up to 2.4 cm. No bowel obstruction. This is also concerning for colonic neoplasm. 3. Hypodense mass of the right hepatic lobe measuring 4.1 x 3.7 x 4.7 cm concerning for hepatic metastasis. 4. Cystic mass arising off the lower pole of the left kidney with solid  nodular component measuring 4.5 x 3.6 x 5 cm with the mural nodule measuring 1.2 x 1.5 x 1.8 cm. Cystic renal cell carcinoma is not excluded. 5. Findings concerning for peritoneal carcinomatosis with  mesenteric nodules as above described. No ascites however is identified. 6. 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 These results were called by telephone at the time of interpretation on 09/18/2018 at 11:58 pm to Dr. Mable Paris , who verbally acknowledged these results. Electronically Signed   By: Ashley Royalty M.D.   On: 09/18/2018 23:58   Ct Abdomen Pelvis W Contrast  Result Date: 09/18/2018 EXAM: CT CHEST, ABDOMEN, AND PELVIS WITH CONTRAST TECHNIQUE: Multidetector CT imaging of the chest, abdomen and pelvis was performed following the standard protocol during bolus administration of intravenous contrast. CONTRAST:  21mL OMNIPAQUE IOHEXOL 300 MG/ML  SOLN COMPARISON:  Same day CXR FINDINGS: CT CHEST FINDINGS Cardiovascular: Conventional branch pattern of the great vessels. Nonaneurysmal thoracic aorta. No large central pulmonary embolus. Heart size is top normal without pericardial effusion or thickening. Mediastinum/Nodes: Mild thyromegaly with retroclavicular extension of the thyroid gland. No dominant mass. Midline patent trachea. Mainstem bronchi appear patent. Mild enlargement of mediastinal lymph nodes in the right upper paratracheal, prevascular, left lower paratracheal and bilateral hilar lymph node stations ranging in size from 10 mm through 12 mm. The CT appearance of the esophagus is unremarkable. Lungs/Pleura: Interstitial thickening likely representing interstitial edema with ill-defined patchy multilobar masslike bilateral airspace opacities and peribronchial thickening are identified bilaterally. The largest is noted in the superior segment of left lower lobe measuring approximately 2 x 2.4 x 1.8 cm with smaller ill-defined nodular opacities scattered throughout both lungs. No effusion or pneumothorax. Musculoskeletal: No chest wall mass or suspicious bone lesions identified. CT ABDOMEN PELVIS FINDINGS Hepatobiliary: Concerning ill-defined hypodense lesion in the posterior  segment of right hepatic lobe measuring 4.1 x 3.7 x 4.7 cm in transverse by AP by craniocaudad suspicious for hepatic metastasis. No biliary dilatation. Nondistended gallbladder containing a 1.6 cm gallstone. Pancreas: Hypodense ill-defined mass measuring 4.8 x 4.5 x 4.4 cm centered within the body of the pancreas concerning for pancreatic carcinoma. No ductal dilatation. Patent portal and splenic veins. Spleen: Normal Adrenals/Urinary Tract: Bilobed hypodense masslike abnormality in the left adrenal gland likely representing metastatic disease measuring at least 3.8 x 1.6 cm, series 2/49. Exophytic complex cystic mass arising lower pole of the left kidney measuring 4.5 x 3.6 x 5 cm with mural nodule along the caudal aspect measuring up to 1.2 x 1.5 x 1.8 cm. Hypodense lesions of the right kidney are more likely to represent simple cysts though some are too small to further characterize. The largest is in the lower pole posteriorly measuring 1.8 cm in diameter. Stomach/Bowel: The stomach is nondistended. Normal small bowel rotation. Slight fluid-filled distention of distal and mid small bowel loops without mechanical obstruction. Normal appendix, terminal and distal ileum. Moderate transmural thickening of the transverse colon with ill-defined masslike abnormality involving the distal transverse colon measuring 5.2 x 7.4 x 2.9 cm, series 2/59 with single wall thickness up to 2.4 cm. There are adjacent peritoneal masses concerning for peritoneal carcinomatosis measuring 1.7 cm in the left hemiabdomen, series 2/60 with smaller mesenteric nodules in the left upper quadrant measuring up to 0.8 cm and 1.4 cm. Mesenteric edema and mesenteric nodules are also noted further caudad within the left hemiabdomen, the largest measuring up to 2 cm, series 2/68. Smaller left pelvic mesenteric nodule measuring 0.8 cm is  seen on the left. Vascular/Lymphatic: Mild aortoiliac atherosclerosis without aneurysm or dissection. No  retroperitoneal or definite mesenteric adenopathy. Reproductive: Prostate is unremarkable. Other: No free air or free fluid. Musculoskeletal: Suspicious osteolytic appearance of the right femoral head on axial image 107, series 2. A more circumscribed left femoral neck lesion measuring up to 1.5 cm with ill-defined right posterior iliac bone 2.1 cm lesion. IMPRESSION: Chest CT: 1. Ill-defined masslike opacities scattered throughout both lungs with associated hilar and mediastinal lymphadenopathy concerning for metastatic disease. Given the masslike abnormalities noted within the abdomen involving the pancreas and transverse colon, suspect that these are more likely metastatic and not due to a primary pulmonary neoplasm. 2. Nonaneurysmal thoracic aorta. 3. No acute pulmonary embolus. CT AP: 1. Hypodense mass centered within the body of the pancreas measuring 4.8 x 4.5 x 4.4 cm concerning for pancreatic carcinoma. 2. Masslike abnormality of the distal transverse colon measuring 5.2 x 7.4 x 2.9 cm with single wall thickness up to 2.4 cm. No bowel obstruction. This is also concerning for colonic neoplasm. 3. Hypodense mass of the right hepatic lobe measuring 4.1 x 3.7 x 4.7 cm concerning for hepatic metastasis. 4. Cystic mass arising off the lower pole of the left kidney with solid nodular component measuring 4.5 x 3.6 x 5 cm with the mural nodule measuring 1.2 x 1.5 x 1.8 cm. Cystic renal cell carcinoma is not excluded. 5. Findings concerning for peritoneal carcinomatosis with mesenteric nodules as above described. No ascites however is identified. 6. 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 These results were called by telephone at the time of interpretation on 09/18/2018 at 11:58 pm to Dr. Mable Paris , who verbally acknowledged these results. Electronically Signed   By: Ashley Royalty M.D.   On: 09/18/2018 23:58    Metastatic cancer [facility]) # 58 year old male patient history of  alcohol/smoking-currently admitted to the hospital cough/shortness of breath-on CT scan noted to have multiple lesions concerning for metastatic disease  #Pancreatic head mass/colonic mass/liver lesion/adrenal lesion/mesenteric adenopathy/lung lesions/bilateral femoral lesion/cystic renal mass-primary malignancy either pancreatic versus colonic versus synchronous.  Tumor markers pending.  Discussed with the patient [through sign language interpreter]-that lesion needs to be biopsied for further evaluation/management.  Patient is interested.  I also spoke to patient's brother Ovid Curd over the phone [phone]]; regarding the above plan he agrees.  I spoke to Dr. Bartholome Bill from interventional radiology; who feels omental lesion left lower quadrant might be most easily accessible.  CT-guided biopsy ordered.  N.p.o. post midnight continue to hold Lovenox DVT prophylaxis.  #Anemia-hemoglobin around 8-stable.  No obvious iron deficiency noted likely anemia of chronic disease.  #History of smoking/alcohol  #Significant family history of cancer father mother sister-we will likely need genetic testing/however await above biopsy.  #Mild thrombocytopenia-platelets 127 question alcohol versus others.  Today improved at 143.  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  Patient will need further imaging/and also bisphosphonates.  Discussed with Dr. Nathaniel Man.  Also discussed with the patient's nurse.   # 40 minutes face-to-face with the patient/family discussing the above plan of care; more than 50% of time spent on possible diagnosis/ counseling and coordination.    Cammie Sickle, MD 09/20/2018  1:21 PM

## 2018-09-20 NOTE — Progress Notes (Signed)
[facility]: Eugene Burton    MR#:  [id number]  DATE OF BIRTH:  [birth year]  SUBJECTIVE:  CHIEF COMPLAINT:   Chief Complaint  Patient presents with  . 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  Communicated with patient using the video interpreter in the room  No new complaint this morning.  No fevers.  Patient still having some intermittent cough.  Oncologist was also at bedside evaluating patient.  REVIEW OF SYSTEMS:  Review of Systems  Constitutional: Positive for weight loss. Negative for chills and fever.  HENT: Positive for hearing loss. Negative for congestion and ear discharge.   Eyes: Negative for blurred vision and double vision.  Respiratory: Positive for cough and shortness of breath.   Cardiovascular: Negative for chest pain and palpitations.  Gastrointestinal: Negative for abdominal pain, heartburn and vomiting.  Genitourinary: Negative for dysuria and urgency.  Musculoskeletal: Negative for myalgias and neck pain.  Skin: Negative for itching and rash.  Neurological: Negative for dizziness and headaches.  Psychiatric/Behavioral: Negative for depression and suicidal ideas.    DRUG ALLERGIES:  No Known Allergies VITALS:  Blood pressure 117/86, pulse (!) 118, temperature 98 F (36.7 C), temperature source Oral, resp. rate 14, height 5\' 3"  (1.6 m), weight 51.3 kg, SpO2 99 %. PHYSICAL EXAMINATION:   Physical Exam  Constitutional: He is oriented to person, place, and time. He appears well-developed and well-nourished.  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  HENT:  Head: Normocephalic and atraumatic.  Eyes: Pupils are equal, round, and reactive to light. Conjunctivae and EOM are normal.  Neck: Normal range of motion. Neck supple.  Cardiovascular: Normal rate, regular rhythm and normal heart sounds.  Respiratory: Effort normal and breath sounds normal. No respiratory distress.  GI: Soft. Bowel  sounds are normal.  Musculoskeletal: Normal range of motion.        General: No edema.  Neurological: He is alert and oriented to person, place, and time.  Communicated with patient using the video interpreter.  Skin: Skin is warm and dry.   LABORATORY PANEL:  Male CBC Recent Labs  Lab 09/20/18 0419  WBC 6.8  HGB 8.6*  HCT 26.3*  PLT 143*   ------------------------------------------------------------------------------------------------------------------ Chemistries  Recent Labs  Lab 09/18/18 1806  09/20/18 0419  NA 130*   < > 132*  K 3.2*   < > 3.4*  CL 93*   < > 101  CO2 27   < > 23  GLUCOSE 172*   < > 123*  BUN 16   < > 9  CREATININE 0.88   < > 0.70  CALCIUM 8.6*   < > 8.4*  MG  --    < > 1.6*  AST 24  --   --   ALT 17  --   --   ALKPHOS 279*  --   --   BILITOT 0.6  --   --    < > = values in this interval not displayed.   RADIOLOGY:  No results found. ASSESSMENT AND PLAN:   1.  Bilateral  metastatic lung disease Patient presented to the emergency room with complaints of a few day history of cough. CT scan of the chest in the emergency room revealed ill-defined masslike opacities scattered throughout both lungs with associated hilar and mediastinal lymphadenopathy concerning for metastatic disease.  CT scan of the abdomen also revealed pancreatic lesions concerning for pancreatic carcinoma as well  as right hepatic lobe mass and cystic mass in the left kidney with other findings concerning for peritoneal carcinomatosis. Patient seen by oncologist this morning.  Had extensive discussion with patient and family members.  Decision was to proceed with biopsy of omental lesion by interventional radiology in a.m.  Lovenox on hold.  2.  Hyponatremia with sodium level of 128. May be related to underlying lung malignancy and dehydration.  Gentle IV fluid hydration with improvement in sodium level to 134 Hypokalemia; Replaced.  Follow-up on repeat levels in a.m.  3.   Anemia Likely due to anemia of chronic disease.  Hemoglobin remained stable.  Follow-up on repeat levels in a.m.  4.  Thrombocytopenia Platelet count stable at 143.  Monitor.  -DVT PPx: Lovenox discontinued due to plans for biopsy in a.m. Order SCDs -Code status: Full code.  All the records are reviewed and case discussed with Care Management/Social Worker. Management plans discussed with the patient, family and they are in agreement.  CODE STATUS: Full Code  TOTAL TIME TAKING CARE OF THIS PATIENT: 34 minutes.   More than 50% of the time was spent in counseling/coordination of care: YES  POSSIBLE D/C IN 3 DAYS, DEPENDING ON CLINICAL CONDITION.   Jean Alejos M.D on 09/20/2018 at 1:51 PM  Between 7am to 6pm - Pager - [phone]  After 6pm go to [email or web address] - Proofreader  [employer] [facility]  Office  [phone]  CC: Primary care physician; Patient, No Pcp Per  Note: This dictation was prepared with Dragon dictation along with smaller phrase technology. Any transcriptional errors that result from this process are unintentional.

## 2018-09-21 ENCOUNTER — Inpatient Hospital Stay: Payer: Medicare HMO

## 2018-09-21 DIAGNOSIS — E440 Moderate protein-calorie malnutrition: Secondary | ICD-10-CM

## 2018-09-21 LAB — TYPE AND SCREEN
ABO/RH(D): O POS
Antibody Screen: POSITIVE
Unit division: 0
Unit division: 0

## 2018-09-21 LAB — CBC
HCT: 25.3 % — ABNORMAL LOW (ref 39.0–52.0)
Hemoglobin: 8.2 g/dL — ABNORMAL LOW (ref 13.0–17.0)
MCH: 27.1 pg (ref 26.0–34.0)
MCHC: 32.4 g/dL (ref 30.0–36.0)
MCV: 83.5 fL (ref 80.0–100.0)
Platelets: 135 10*3/uL — ABNORMAL LOW (ref 150–400)
RBC: 3.03 MIL/uL — ABNORMAL LOW (ref 4.22–5.81)
RDW: 17 % — ABNORMAL HIGH (ref 11.5–15.5)
WBC: 5 10*3/uL (ref 4.0–10.5)
nRBC: 0 % (ref 0.0–0.2)

## 2018-09-21 LAB — BASIC METABOLIC PANEL
Anion gap: 8 (ref 5–15)
BUN: 10 mg/dL (ref 6–20)
CO2: 23 mmol/L (ref 22–32)
CREATININE: 0.73 mg/dL (ref 0.61–1.24)
Calcium: 8.4 mg/dL — ABNORMAL LOW (ref 8.9–10.3)
Chloride: 100 mmol/L (ref 98–111)
GFR calc Af Amer: >60 mL/min (ref >60)
GFR calc non Af Amer: >60 mL/min (ref >60)
Glucose, Bld: 117 mg/dL — ABNORMAL HIGH (ref 70–99)
Potassium: 3.3 mmol/L — ABNORMAL LOW (ref 3.5–5.1)
Sodium: 131 mmol/L — ABNORMAL LOW (ref 135–145)

## 2018-09-21 LAB — MAGNESIUM: Magnesium: 2 mg/dL (ref 1.7–2.4)

## 2018-09-21 LAB — BPAM RBC
Blood Product Expiration Date: 202001292359
Blood Product Expiration Date: 202001292359
Unit Type and Rh: 5100
Unit Type and Rh: 5100

## 2018-09-21 LAB — CEA: CEA: 145 ng/mL — ABNORMAL HIGH (ref 0.0–4.7)

## 2018-09-21 MED ORDER — MIDAZOLAM HCL 5 MG/5ML IJ SOLN
INTRAMUSCULAR | Status: AC | PRN
  Administered 2018-09-21 (×2): 1 mg via INTRAVENOUS

## 2018-09-21 MED ORDER — MIDAZOLAM HCL 5 MG/5ML IJ SOLN
INTRAMUSCULAR | Status: AC
  Filled 2018-09-21: qty 5

## 2018-09-21 MED ORDER — POTASSIUM CHLORIDE CRYS ER 20 MEQ PO TBCR
20.0000 meq | EXTENDED_RELEASE_TABLET | Freq: Once | ORAL | Status: AC
  Administered 2018-09-21: 20 meq via ORAL
  Filled 2018-09-21: qty 1

## 2018-09-21 MED ORDER — HYDROCOD POLST-CPM POLST ER 10-8 MG/5ML PO SUER
ORAL | Status: AC
  Filled 2018-09-21: qty 10

## 2018-09-21 MED ORDER — ENSURE ENLIVE PO LIQD: 237.0000 mL | Freq: Three times a day (TID) | ORAL | Status: DC

## 2018-09-21 MED ORDER — BENZONATATE 100 MG PO CAPS
100.0000 mg | ORAL_CAPSULE | Freq: Three times a day (TID) | ORAL | Status: DC | PRN
  Administered 2018-09-21 – 2018-09-22 (×2): 100 mg via ORAL
  Filled 2018-09-21 (×2): qty 1

## 2018-09-21 MED ORDER — ADULT MULTIVITAMIN W/MINERALS CH
1.0000 | ORAL_TABLET | Freq: Every day | ORAL | Status: DC
  Administered 2018-09-22: 1 via ORAL
  Filled 2018-09-21: qty 1

## 2018-09-21 MED ORDER — FENTANYL CITRATE (PF) 100 MCG/2ML IJ SOLN
INTRAMUSCULAR | Status: AC | PRN
  Administered 2018-09-21: 25 ug via INTRAVENOUS
  Administered 2018-09-21: 50 ug via INTRAVENOUS

## 2018-09-21 MED ORDER — HYDROCOD POLST-CPM POLST ER 10-8 MG/5ML PO SUER
ORAL | Status: AC | PRN
  Administered 2018-09-21: 5 mL via ORAL

## 2018-09-21 MED ORDER — FENTANYL CITRATE (PF) 100 MCG/2ML IJ SOLN
INTRAMUSCULAR | Status: AC
  Filled 2018-09-21: qty 4

## 2018-09-21 NOTE — Progress Notes (Signed)
Eugene Burton at [place]: Eugene Burton    MR#:  [id number]  DATE OF BIRTH:  [birth year]  SUBJECTIVE:  CHIEF COMPLAINT:   Chief Complaint  Patient presents with  . 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  Communication is with by using the video interpreter in the room  No new complaint this morning.  Patient still having intermittent cough.  Placed on PRN Tessalon.  Scheduled for biopsy of peritoneal mass by interventional radiologist today.  No fevers.    REVIEW OF SYSTEMS:  Review of Systems  Constitutional: Positive for weight loss. Negative for chills and fever.  HENT: Positive for hearing loss. Negative for congestion and ear discharge.   Eyes: Negative for blurred vision and double vision.  Respiratory: Positive for cough and shortness of breath.   Cardiovascular: Negative for chest pain and palpitations.  Gastrointestinal: Negative for abdominal pain, heartburn and vomiting.  Genitourinary: Negative for dysuria and urgency.  Musculoskeletal: Negative for myalgias and neck pain.  Skin: Negative for itching and rash.  Neurological: Negative for dizziness and headaches.  Psychiatric/Behavioral: Negative for depression and suicidal ideas.    DRUG ALLERGIES:  No Known Allergies VITALS:  Blood pressure (!) 157/98, pulse (!) 122, temperature 98.9 F (37.2 C), temperature source Oral, resp. rate 18, height 5\' 3"  (1.6 m), weight 51.3 kg, SpO2 91 %. PHYSICAL EXAMINATION:   Physical Exam  Constitutional: He is oriented to person, place, and time. He appears well-developed and well-nourished.  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  HENT:  Head: Normocephalic and atraumatic.  Eyes: Pupils are equal, round, and reactive to light. Conjunctivae and EOM are normal.  Neck: Normal range of motion. Neck supple.  Cardiovascular: Normal rate, regular rhythm and normal heart sounds.  Respiratory: Effort normal and breath  sounds normal. No respiratory distress.  GI: Soft. Bowel sounds are normal.  Musculoskeletal: Normal range of motion.        General: No edema.  Neurological: He is alert and oriented to person, place, and time.  Communicated with patient using the video interpreter.  Skin: Skin is warm and dry.   LABORATORY PANEL:  Male CBC Recent Labs  Lab 09/21/18 0703  WBC 5.0  HGB 8.2*  HCT 25.3*  PLT 135*   ------------------------------------------------------------------------------------------------------------------ Chemistries  Recent Labs  Lab 09/18/18 1806  09/21/18 0703  NA 130*   < > 131*  K 3.2*   < > 3.3*  CL 93*   < > 100  CO2 27   < > 23  GLUCOSE 172*   < > 117*  BUN 16   < > 10  CREATININE 0.88   < > 0.73  CALCIUM 8.6*   < > 8.4*  MG  --    < > 2.0  AST 24  --   --   ALT 17  --   --   ALKPHOS 279*  --   --   BILITOT 0.6  --   --    < > = values in this interval not displayed.   RADIOLOGY:  No results found. ASSESSMENT AND PLAN:   1.  Bilateral  metastatic lung disease Patient presented to the emergency room with complaints of a few day history of cough. CT scan of the chest in the emergency room revealed ill-defined masslike opacities scattered throughout both lungs with associated hilar and mediastinal lymphadenopathy concerning for metastatic disease.  CT scan of the  abdomen also revealed pancreatic lesions concerning for pancreatic carcinoma as well as right hepatic lobe mass and cystic mass in the left kidney with other findings concerning for peritoneal carcinomatosis. Patient seen by oncologist who extensive discussion with patient and family members.  Decision was to proceed with biopsy of omental lesion by interventional radiology today Patient had CT-guided biopsy of peritoneal mass by interventional radiologist today.  To follow-up on pathology report when read.  2.  Hyponatremia with sodium level of 128. May be related to underlying lung malignancy and  dehydration.  Gentle IV fluid hydration with improvement in sodium level to 131 Hypokalemia; Replaced.  Follow-up on repeat levels in a.m.  3.  Anemia Likely due to anemia of chronic disease.  Hemoglobin remained stable.  Follow-up on repeat levels in a.m.  4.  Thrombocytopenia Platelet count stable at 135.  Monitor.  -DVT PPx: Lovenox discontinued due to biopsy today. Currently on SCDs. Please consider resuming Lovenox in a.m.  -Code status: Full code.  All the records are reviewed and case discussed with Care Management/Social Worker. Management plans discussed with the patient, family and they are in agreement.  CODE STATUS: Full Code  TOTAL TIME TAKING CARE OF THIS PATIENT: 35 minutes.   More than 50% of the time was spent in counseling/coordination of care: YES  POSSIBLE D/C IN 2DAYS, DEPENDING ON CLINICAL CONDITION.   Keilyn Haggard M.D on 09/21/2018 at 11:24 AM  Between 7am to 6pm - Pager - [phone]  After 6pm go to [email or web address] - Proofreader  [facility]  Office  [phone]  CC: Primary care physician; Patient, No Pcp Per  Note: This dictation was prepared with Dragon dictation along with smaller phrase technology. Any transcriptional errors that result from this process are unintentional.

## 2018-09-21 NOTE — H&P (Signed)
Chief Complaint: Patient was seen in consultation today for peritoneal mass biopsy at the request of Dr. Charlaine Dalton  Referring Physician(s): Dr. Charlaine Dalton  Patient Status: [facility] - In-pt  History of Present Illness: Eugene Burton is a 58 y.o. male presenting with cough, dyspnea, appetite loss, weight loss and malaise. CT demonstrates evidence of a large pancreatic mass, multiple peritoneal masses, liver mass, left adrenal mass, left renal mass and multiple pulmonary masses. 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  Currently coughing up blood-tinged sputum.  Past Medical History:  Diagnosis Date  . Deaf    patient needs interpreter   . GERD (gastroesophageal reflux disease)   . Hypertension     Past Surgical History:  Procedure Laterality Date  . broken leg    . OPEN REDUCTION INTERNAL FIXATION (ORIF) CALCANEAL FRACTURE WITH FUSION Right    right lower leg surgery s/p fracture    Allergies: Patient has no known allergies.  Medications: Prior to Admission medications   Medication Sig Start Date End Date Taking? Authorizing Provider  benzonatate (TESSALON PERLES) 100 MG capsule Take 1 capsule (100 mg total) by mouth 3 (three) times daily as needed for cough. 08/03/18  Yes Iloabachie, Chioma E, NP  losartan-hydrochlorothiazide (HYZAAR) 50-12.5 MG tablet Take 1 tablet by mouth daily. 08/03/18  Yes Iloabachie, Chioma E, NP  ranitidine (ZANTAC) 150 MG tablet Take 1 tablet (150 mg total) by mouth 2 (two) times daily. 08/03/18  Yes Iloabachie, Chioma E, NP  nebivolol (BYSTOLIC) 5 MG tablet Take 1 tablet (5 mg total) by mouth daily. Patient not taking: Reported on 09/19/2018 08/03/18   Caryl Asp E, NP     Family History  Problem Relation Age of Onset  . Cancer Sister   . Cancer Brother        skin cancer    Social History   Socioeconomic History  . Marital status: Widowed    Spouse name: Not on file  . Number  of children: Not on file  . Years of education: Not on file  . Highest education level: Not on file  Occupational History  . Not on file  Social Needs  . Financial resource strain: Somewhat hard  . Food insecurity:    Worry: Sometimes true    Inability: Sometimes true  . Transportation needs:    Medical: Yes    Non-medical: Yes  Tobacco Use  . Smoking status: Current Some Day Smoker    Packs/day: 1.00    Types: Cigarettes  . Smokeless tobacco: Never Used  . Tobacco comment: materials given to patient as he is "slightly" interested in quittig  Substance and Sexual Activity  . Alcohol use: No  . Drug use: No  . Sexual activity: Not Currently  Lifestyle  . Physical activity:    Days per week: 0 days    Minutes per session: 0 min  . Stress: Rather much  Relationships  . Social connections:    Talks on phone: More than three times a week    Gets together: More than three times a week    Attends religious service: Never    Active member of club or organization: Yes    Attends meetings of clubs or organizations: 1 to 4 times per year    Relationship status: Widowed  Other Topics Concern  . Not on file  Social History Narrative   Recently widowed.  Pt has not been feeling well.    ECOG Status: 3 - Symptomatic, >  50% confined to bed  Review of Systems: A 12 point ROS discussed and pertinent positives are indicated in the HPI above.  All other systems are negative.  Review of Systems  Vital Signs: BP (!) 149/87 (BP Location: Right Arm)   Pulse (!) 109   Temp 98.9 F (37.2 C) (Oral)   Resp (!) 34   Ht 5\' 3"  (1.6 m)   Wt 51.3 kg   SpO2 97%   BMI 20.02 kg/m   Physical Exam  Imaging: Dg Chest 2 View  Result Date: 09/18/2018 CLINICAL DATA:  Cough. Weight loss, patient has been feeling poorly for 3 months. EXAM: CHEST - 2 VIEW COMPARISON:  Remote chest radiograph 04/07/2008 FINDINGS: Heart is normal in size. Diffuse peribronchial and interstitial thickening. Slight  bilateral hilar prominence. No confluent airspace disease or radiographic findings of mass. Minimal fluid in the fissures without subpulmonic effusion. No acute osseous abnormalities are seen. IMPRESSION: 1. Diffuse peribronchial interstitial thickening with diagnostic considerations of pulmonary edema or bronchitis. 2. Mild bilateral hilar prominence is nonspecific, may be vascular overlap versus adenopathy. Consider further evaluation with chest CT with contrast for further evaluation. 3. No radiographic findings of pneumonia or pulmonary mass. Electronically Signed   By: Keith Rake M.D.   On: 09/18/2018 22:39   Ct Chest W Contrast  Result Date: 09/18/2018 EXAM: CT CHEST, ABDOMEN, AND PELVIS WITH CONTRAST TECHNIQUE: Multidetector CT imaging of the chest, abdomen and pelvis was performed following the standard protocol during bolus administration of intravenous contrast. CONTRAST:  94mL OMNIPAQUE IOHEXOL 300 MG/ML  SOLN COMPARISON:  Same day CXR FINDINGS: CT CHEST FINDINGS Cardiovascular: Conventional branch pattern of the great vessels. Nonaneurysmal thoracic aorta. No large central pulmonary embolus. Heart size is top normal without pericardial effusion or thickening. Mediastinum/Nodes: Mild thyromegaly with retroclavicular extension of the thyroid gland. No dominant mass. Midline patent trachea. Mainstem bronchi appear patent. Mild enlargement of mediastinal lymph nodes in the right upper paratracheal, prevascular, left lower paratracheal and bilateral hilar lymph node stations ranging in size from 10 mm through 12 mm. The CT appearance of the esophagus is unremarkable. Lungs/Pleura: Interstitial thickening likely representing interstitial edema with ill-defined patchy multilobar masslike bilateral airspace opacities and peribronchial thickening are identified bilaterally. The largest is noted in the superior segment of left lower lobe measuring approximately 2 x 2.4 x 1.8 cm with smaller ill-defined  nodular opacities scattered throughout both lungs. No effusion or pneumothorax. Musculoskeletal: No chest wall mass or suspicious bone lesions identified. CT ABDOMEN PELVIS FINDINGS Hepatobiliary: Concerning ill-defined hypodense lesion in the posterior segment of right hepatic lobe measuring 4.1 x 3.7 x 4.7 cm in transverse by AP by craniocaudad suspicious for hepatic metastasis. No biliary dilatation. Nondistended gallbladder containing a 1.6 cm gallstone. Pancreas: Hypodense ill-defined mass measuring 4.8 x 4.5 x 4.4 cm centered within the body of the pancreas concerning for pancreatic carcinoma. No ductal dilatation. Patent portal and splenic veins. Spleen: Normal Adrenals/Urinary Tract: Bilobed hypodense masslike abnormality in the left adrenal gland likely representing metastatic disease measuring at least 3.8 x 1.6 cm, series 2/49. Exophytic complex cystic mass arising lower pole of the left kidney measuring 4.5 x 3.6 x 5 cm with mural nodule along the caudal aspect measuring up to 1.2 x 1.5 x 1.8 cm. Hypodense lesions of the right kidney are more likely to represent simple cysts though some are too small to further characterize. The largest is in the lower pole posteriorly measuring 1.8 cm in diameter. Stomach/Bowel: The stomach is nondistended.  Normal small bowel rotation. Slight fluid-filled distention of distal and mid small bowel loops without mechanical obstruction. Normal appendix, terminal and distal ileum. Moderate transmural thickening of the transverse colon with ill-defined masslike abnormality involving the distal transverse colon measuring 5.2 x 7.4 x 2.9 cm, series 2/59 with single wall thickness up to 2.4 cm. There are adjacent peritoneal masses concerning for peritoneal carcinomatosis measuring 1.7 cm in the left hemiabdomen, series 2/60 with smaller mesenteric nodules in the left upper quadrant measuring up to 0.8 cm and 1.4 cm. Mesenteric edema and mesenteric nodules are also noted further  caudad within the left hemiabdomen, the largest measuring up to 2 cm, series 2/68. Smaller left pelvic mesenteric nodule measuring 0.8 cm is seen on the left. Vascular/Lymphatic: Mild aortoiliac atherosclerosis without aneurysm or dissection. No retroperitoneal or definite mesenteric adenopathy. Reproductive: Prostate is unremarkable. Other: No free air or free fluid. Musculoskeletal: Suspicious osteolytic appearance of the right femoral head on axial image 107, series 2. A more circumscribed left femoral neck lesion measuring up to 1.5 cm with ill-defined right posterior iliac bone 2.1 cm lesion. IMPRESSION: Chest CT: 1. Ill-defined masslike opacities scattered throughout both lungs with associated hilar and mediastinal lymphadenopathy concerning for metastatic disease. Given the masslike abnormalities noted within the abdomen involving the pancreas and transverse colon, suspect that these are more likely metastatic and not due to a primary pulmonary neoplasm. 2. Nonaneurysmal thoracic aorta. 3. No acute pulmonary embolus. CT AP: 1. Hypodense mass centered within the body of the pancreas measuring 4.8 x 4.5 x 4.4 cm concerning for pancreatic carcinoma. 2. Masslike abnormality of the distal transverse colon measuring 5.2 x 7.4 x 2.9 cm with single wall thickness up to 2.4 cm. No bowel obstruction. This is also concerning for colonic neoplasm. 3. Hypodense mass of the right hepatic lobe measuring 4.1 x 3.7 x 4.7 cm concerning for hepatic metastasis. 4. Cystic mass arising off the lower pole of the left kidney with solid nodular component measuring 4.5 x 3.6 x 5 cm with the mural nodule measuring 1.2 x 1.5 x 1.8 cm. Cystic renal cell carcinoma is not excluded. 5. Findings concerning for peritoneal carcinomatosis with mesenteric nodules as above described. No ascites however is identified. 6. 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 These results were called by telephone at the time of  interpretation on 09/18/2018 at 11:58 pm to Dr. Mable Paris , who verbally acknowledged these results. Electronically Signed   By: Ashley Royalty M.D.   On: 09/18/2018 23:58   Ct Abdomen Pelvis W Contrast  Result Date: 09/18/2018 EXAM: CT CHEST, ABDOMEN, AND PELVIS WITH CONTRAST TECHNIQUE: Multidetector CT imaging of the chest, abdomen and pelvis was performed following the standard protocol during bolus administration of intravenous contrast. CONTRAST:  49mL OMNIPAQUE IOHEXOL 300 MG/ML  SOLN COMPARISON:  Same day CXR FINDINGS: CT CHEST FINDINGS Cardiovascular: Conventional branch pattern of the great vessels. Nonaneurysmal thoracic aorta. No large central pulmonary embolus. Heart size is top normal without pericardial effusion or thickening. Mediastinum/Nodes: Mild thyromegaly with retroclavicular extension of the thyroid gland. No dominant mass. Midline patent trachea. Mainstem bronchi appear patent. Mild enlargement of mediastinal lymph nodes in the right upper paratracheal, prevascular, left lower paratracheal and bilateral hilar lymph node stations ranging in size from 10 mm through 12 mm. The CT appearance of the esophagus is unremarkable. Lungs/Pleura: Interstitial thickening likely representing interstitial edema with ill-defined patchy multilobar masslike bilateral airspace opacities and peribronchial thickening are identified bilaterally. The largest is  noted in the superior segment of left lower lobe measuring approximately 2 x 2.4 x 1.8 cm with smaller ill-defined nodular opacities scattered throughout both lungs. No effusion or pneumothorax. Musculoskeletal: No chest wall mass or suspicious bone lesions identified. CT ABDOMEN PELVIS FINDINGS Hepatobiliary: Concerning ill-defined hypodense lesion in the posterior segment of right hepatic lobe measuring 4.1 x 3.7 x 4.7 cm in transverse by AP by craniocaudad suspicious for hepatic metastasis. No biliary dilatation. Nondistended gallbladder containing a 1.6 cm  gallstone. Pancreas: Hypodense ill-defined mass measuring 4.8 x 4.5 x 4.4 cm centered within the body of the pancreas concerning for pancreatic carcinoma. No ductal dilatation. Patent portal and splenic veins. Spleen: Normal Adrenals/Urinary Tract: Bilobed hypodense masslike abnormality in the left adrenal gland likely representing metastatic disease measuring at least 3.8 x 1.6 cm, series 2/49. Exophytic complex cystic mass arising lower pole of the left kidney measuring 4.5 x 3.6 x 5 cm with mural nodule along the caudal aspect measuring up to 1.2 x 1.5 x 1.8 cm. Hypodense lesions of the right kidney are more likely to represent simple cysts though some are too small to further characterize. The largest is in the lower pole posteriorly measuring 1.8 cm in diameter. Stomach/Bowel: The stomach is nondistended. Normal small bowel rotation. Slight fluid-filled distention of distal and mid small bowel loops without mechanical obstruction. Normal appendix, terminal and distal ileum. Moderate transmural thickening of the transverse colon with ill-defined masslike abnormality involving the distal transverse colon measuring 5.2 x 7.4 x 2.9 cm, series 2/59 with single wall thickness up to 2.4 cm. There are adjacent peritoneal masses concerning for peritoneal carcinomatosis measuring 1.7 cm in the left hemiabdomen, series 2/60 with smaller mesenteric nodules in the left upper quadrant measuring up to 0.8 cm and 1.4 cm. Mesenteric edema and mesenteric nodules are also noted further caudad within the left hemiabdomen, the largest measuring up to 2 cm, series 2/68. Smaller left pelvic mesenteric nodule measuring 0.8 cm is seen on the left. Vascular/Lymphatic: Mild aortoiliac atherosclerosis without aneurysm or dissection. No retroperitoneal or definite mesenteric adenopathy. Reproductive: Prostate is unremarkable. Other: No free air or free fluid. Musculoskeletal: Suspicious osteolytic appearance of the right femoral head on  axial image 107, series 2. A more circumscribed left femoral neck lesion measuring up to 1.5 cm with ill-defined right posterior iliac bone 2.1 cm lesion. IMPRESSION: Chest CT: 1. Ill-defined masslike opacities scattered throughout both lungs with associated hilar and mediastinal lymphadenopathy concerning for metastatic disease. Given the masslike abnormalities noted within the abdomen involving the pancreas and transverse colon, suspect that these are more likely metastatic and not due to a primary pulmonary neoplasm. 2. Nonaneurysmal thoracic aorta. 3. No acute pulmonary embolus. CT AP: 1. Hypodense mass centered within the body of the pancreas measuring 4.8 x 4.5 x 4.4 cm concerning for pancreatic carcinoma. 2. Masslike abnormality of the distal transverse colon measuring 5.2 x 7.4 x 2.9 cm with single wall thickness up to 2.4 cm. No bowel obstruction. This is also concerning for colonic neoplasm. 3. Hypodense mass of the right hepatic lobe measuring 4.1 x 3.7 x 4.7 cm concerning for hepatic metastasis. 4. Cystic mass arising off the lower pole of the left kidney with solid nodular component measuring 4.5 x 3.6 x 5 cm with the mural nodule measuring 1.2 x 1.5 x 1.8 cm. Cystic renal cell carcinoma is not excluded. 5. Findings concerning for peritoneal carcinomatosis with mesenteric nodules as above described. No ascites however is identified. 6. Osteolytic lesions of the  right iliac bone and both proximal femora. These results were called by telephone at the time of interpretation on 09/18/2018 at 11:58 pm to Dr. Mable Paris , who verbally acknowledged these results. Electronically Signed   By: Ashley Royalty M.D.   On: 09/18/2018 23:58    Labs:  CBC: Recent Labs    09/18/18 1806 09/19/18 1219 09/20/18 0419 09/21/18 0703  WBC 8.5 4.7 6.8 5.0  HGB 9.6* 8.3* 8.6* 8.2*  HCT 29.1* 25.7* 26.3* 25.3*  PLT 191 127* 143* 135*    COAGS: Recent Labs    09/20/18 0419  INR 1.23  APTT 41*    BMP: Recent  Labs    09/18/18 1806 09/19/18 1219 09/20/18 0419 09/21/18 0703  NA 130* 128* 132* 131*  K 3.2* 3.3* 3.4* 3.3*  CL 93* 96* 101 100  CO2 27 25 23 23   GLUCOSE 172* 116* 123* 117*  BUN 16 11 9 10   CALCIUM 8.6* 8.3* 8.4* 8.4*  CREATININE 0.88 0.77 0.70 0.73  GFRNONAA >60 >60 >60 >60  GFRAA >60 >60 >60 >60    LIVER FUNCTION TESTS: Recent Labs    06/01/18 1940 09/18/18 1806  BILITOT 0.3 0.6  AST 17 24  ALT 12 17  ALKPHOS 120* 279*  PROT 6.5 6.3*  ALBUMIN 4.1 3.0*    TUMOR MARKERS: CEA: 145, CA 19-9: 3,933   Assessment and Plan:  Seen for biopsy to establish diagnosis. Easiest and safest source likely peritoneal disease in left abdomen. Will proceed with CT guided biopsy today. Consent obtained with assistance of sign-language interpreter.  Risks and benefits discussed with the patient including, but not limited to bleeding, infection, damage to adjacent structures or low yield requiring additional tests. All of the patient's questions were answered, patient is agreeable to proceed. Consent signed and in chart.  Thank you for this interesting consult.  I greatly enjoyed meeting Eugene Burton and look forward to participating in their care.  A copy of this report was sent to the requesting provider on this date.  Electronically Signed: Azzie Roup, MD 09/21/2018, 9:11 AM   I spent a total of 20 Minutes  in face to face in clinical consultation, greater than 50% of which was counseling/coordinating care for CT guided peritoneal mass biopsy.

## 2018-09-21 NOTE — Plan of Care (Signed)
  Problem: Education: Goal: Knowledge of General Education information will improve Description: Including pain rating scale, medication(s)/side effects and non-pharmacologic comfort measures Outcome: Progressing   Problem: Health Behavior/Discharge Planning: Goal: Ability to manage health-related needs will improve Outcome: Progressing   Problem: Clinical Measurements: Goal: Ability to maintain clinical measurements within normal limits will improve Outcome: Progressing Goal: Diagnostic test results will improve Outcome: Progressing   Problem: Nutrition: Goal: Adequate nutrition will be maintained Outcome: Progressing   Problem: Pain Managment: Goal: General experience of comfort will improve Outcome: Progressing   Problem: Safety: Goal: Ability to remain free from injury will improve Outcome: Progressing   Problem: Skin Integrity: Goal: Risk for impaired skin integrity will decrease Outcome: Progressing   

## 2018-09-21 NOTE — Progress Notes (Signed)
[place]   DOB:[birth year]   NI#:[id number]    Subjective: Communication with the patient through sign language interpreter.   Patient had a good night sleep overall.  Complains of mild pain in his abdomen/hips.  Is awaiting a biopsy this morning.  Objective:  Vitals:   09/21/18 1105 09/21/18 1252  BP: (!) 157/98 (!) 147/100  Pulse: (!) 122 (!) 125  Resp: 18 18  Temp:  98.1 F (36.7 C)  SpO2: 91% 92%     Intake/Output Summary (Last 24 hours) at 09/21/2018 1548 Last data filed at 09/21/2018 1445 Gross per 24 hour  Intake 1060.16 ml  Output 600 ml  Net 460.16 ml    Physical Exam  Constitutional: He is oriented to person, place, and time.  Thin built cachectic male patient.  Resting comfortably.  HENT:  Head: Normocephalic and atraumatic.  Mouth/Throat: Oropharynx is clear and moist. No oropharyngeal exudate.  Eyes: Pupils are equal, round, and reactive to light.  Neck: Normal range of motion. Neck supple.  Cardiovascular: Normal rate and regular rhythm.  Pulmonary/Chest: No respiratory distress. He has no wheezes.  Decreased air entry.  Abdominal: Soft. Bowel sounds are normal. He exhibits no distension and no mass. There is no abdominal tenderness. There is no rebound and no guarding.  Musculoskeletal: Normal range of motion.        General: No tenderness or edema.  Neurological: He is alert and oriented to person, place, and time.  Skin: Skin is warm.  Psychiatric: Affect normal.     Labs:  Lab Results  Component Value Date   WBC 5.0 09/21/2018   HGB 8.2 (L) 09/21/2018   HCT 25.3 (L) 09/21/2018   MCV 83.5 09/21/2018   PLT 135 (L) 09/21/2018   NEUTROABS 5.5 06/01/2018    Lab Results  Component Value Date   NA 131 (L) 09/21/2018   K 3.3 (L) 09/21/2018   CL 100 09/21/2018   CO2 23 09/21/2018    Studies:  Ct Biopsy  Result Date: 09/21/2018 CLINICAL DATA:  Pancreatic mass, peritoneal masses, liver mass, left renal mass, left adrenal mass and pulmonary  masses. 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 Presenting for biopsy of one of the peritoneal masses. EXAM: CT GUIDED CORE BIOPSY OF PERITONEAL MASS ANESTHESIA/SEDATION: 2.0 mg IV Versed; 75 mcg IV Fentanyl Total Moderate Sedation Time:  42 minutes. The patient's level of consciousness and physiologic status were continuously monitored during the procedure by Radiology nursing. PROCEDURE: The procedure risks, benefits, and alternatives were explained to the patient. Questions regarding the procedure were encouraged and answered. The patient understands and consents to the procedure. A time-out was performed prior to initiating the procedure. The operative field was prepped with chlorhexidine in a sterile fashion, and a sterile drape was applied covering the operative field. A sterile gown and sterile gloves were used for the procedure. Local anesthesia was provided with 1% Lidocaine. CT was performed in a supine position through the mid to lower abdomen. A site was chosen for biopsy along the left abdominal wall. Under CT guidance, a 17 gauge trocar needle was advanced. After confirming needle tip position, a total of 4 coaxial 18 gauge core biopsy samples were obtained and submitted in formalin. Additional CT was performed after biopsy. COMPLICATIONS: None FINDINGS: Peritoneal soft tissue nodule in the left mid lateral peritoneal cavity was chosen for biopsy and measures approximately 2 cm in maximal diameter. Solid tissue was obtained. There were no immediate complications. IMPRESSION: CT-guided core biopsy performed of peritoneal mass  in the left mid lateral abdomen. Electronically Signed   By: Aletta Edouard M.D.   On: 09/21/2018 14:19    Metastatic cancer [facility]) # 58 year old male patient history of alcohol/smoking-currently admitted to the hospital cough/shortness of breath-on CT scan noted to have multiple lesions concerning for metastatic disease  #Pancreatic head mass/colonic mass/liver  lesion/adrenal lesion/mesenteric adenopathy/lung lesions/bilateral femoral lesion/cystic renal mass-primary malignancy either pancreatic versus colonic versus synchronous.  Patient awaiting biopsy of the omental mass today.  Again discussed with the patient is in agreement.  #Anemia-hemoglobin around 8-stable no obvious iron deficiency noted likely anemia of chronic disease.  #History of smoking/alcohol  #Significant family history of cancer father mother sister-we will likely need genetic testing/however await above biopsy.  #Mild thrombocytopenia-alcohol versus others stable  #Bilateral hip pain-likely second malignancy as noted in the CT scan-recommend pain control for now.  Plan bisphosphonates.  #Discussed that patient will need a follow-up as outpatient in early-mid next week to discuss the pathology/treatment options.     Cammie Sickle, MD 09/21/2018  3:48 PM

## 2018-09-21 NOTE — Procedures (Signed)
Interventional Radiology Procedure Note  Procedure: CT Guided Biopsy of Peritoneal Mass  Complications: None  Estimated Blood Loss: < 10 mL  Findings: 18 G core biopsy of left lateral peritoneal mass performed under CT guidance.  Four core samples obtained and sent to Pathology.  Venetia Night. Kathlene Cote, M.D Pager:  [phone]

## 2018-09-21 NOTE — Progress Notes (Signed)
Initial Nutrition Assessment  DOCUMENTATION CODES:   Non-severe (moderate) malnutrition in context of chronic illness  INTERVENTION:  Provide Ensure Enlive po TID, each supplement provides 350 kcal and 20 grams of protein.  Provide daily MVI.  Encouraged adequate intake of calories and protein at meals.  NUTRITION DIAGNOSIS:   Moderate Malnutrition related to chronic illness(peritoneal mass concerning for peritoneal carcinomatosis) as evidenced by moderate fat depletion, mild muscle depletion, moderate muscle depletion.  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  Met with patient and family members at bedside. Communicated with portable interpreter (ASL; [id number]). Patient difficult to get history from. Interpreter frequently stated that patient would not answer question and was only nodding his head. Patient reports he has not eaten much today. RD was already aware of this as he was NPO for his procedure. Very difficult to get nutrition history from PTA. He is only able to report that he eats small portions throughout the day. After a long discussion he is amenable to drinking Ensure to help meet calorie and protein needs. Encouraged him to continue drinking these after discharge, as well.  Patient also not providing a very reliable weight history. He is reporting his UBW was 128 lbs. Weight on admission was 51.3 kg (113 lbs). He is unsure when he lost his weight. However, patient appears significantly different than the picture in his chart. It appears his UBW was much higher than  128 lbs, but unsure when that picture was taken.  Medications reviewed and include: pantoprazole, NS @ 75 mL/hr.  Labs reviewed: Sodium 131, Potassium 3.3.  Discussed with RN.  NUTRITION - FOCUSED PHYSICAL EXAM:    Most Recent Value  Orbital Region  Moderate depletion  Upper Arm Region  Moderate depletion  Thoracic and Lumbar Region  Moderate depletion  Buccal Region  Moderate depletion  Temple Region  Severe depletion  Clavicle Bone Region  Moderate depletion  Clavicle and Acromion Bone Region  Moderate depletion  Scapular Bone Region  Moderate depletion  Dorsal Hand  Moderate depletion  Patellar Region  Mild depletion  Anterior Thigh Region  Mild depletion  Posterior Calf Region  Mild depletion  Edema (RD Assessment)  None  Hair  Reviewed  Eyes  Unable to assess  Mouth  Unable to assess  Skin  Reviewed  Nails  Reviewed     Diet Order:   Diet Order            Diet regular Room service appropriate? Yes; Fluid consistency: Thin  Diet effective now             EDUCATION NEEDS:   Education needs have been addressed  Skin:  Skin Assessment: Skin Integrity Issues:(ecchymosis; puncture to left abdomen)  Last BM:  09/19/2018  Height:   Ht Readings from Last 1 Encounters:  09/18/18 5' 3" (1.6 m)   Weight:   Wt Readings from Last 1 Encounters:  09/18/18 51.3 kg   Ideal Body Weight:  56.4 kg  BMI:  Body mass index is 20.02 kg/m.  Estimated Nutritional Needs:   Kcal:  7948-0165 (  MSJ x 1.3-1.5)  Protein:  75-90 grams (1.5-1.7 grams/kg)  Fluid:  1.6-1.9 L/day (1 mL/kcal)  Willey Blade, MS, RD, LDN Office: [phone] Pager: [phone] After Hours/Weekend Pager: [phone]

## 2018-09-22 DIAGNOSIS — R05 Cough: Secondary | ICD-10-CM

## 2018-09-22 LAB — BASIC METABOLIC PANEL
Anion gap: 8 (ref 5–15)
BUN: 10 mg/dL (ref 6–20)
CO2: 24 mmol/L (ref 22–32)
Calcium: 8.1 mg/dL — ABNORMAL LOW (ref 8.9–10.3)
Chloride: 98 mmol/L (ref 98–111)
Creatinine, Ser: 0.69 mg/dL (ref 0.61–1.24)
GFR calc Af Amer: >60 mL/min (ref >60)
GFR calc non Af Amer: >60 mL/min (ref >60)
Glucose, Bld: 133 mg/dL — ABNORMAL HIGH (ref 70–99)
Potassium: 3.5 mmol/L (ref 3.5–5.1)
Sodium: 130 mmol/L — ABNORMAL LOW (ref 135–145)

## 2018-09-22 LAB — CBC
HCT: 24.4 % — ABNORMAL LOW (ref 39.0–52.0)
Hemoglobin: 7.8 g/dL — ABNORMAL LOW (ref 13.0–17.0)
MCH: 26.8 pg (ref 26.0–34.0)
MCHC: 32 g/dL (ref 30.0–36.0)
MCV: 83.8 fL (ref 80.0–100.0)
PLATELETS: 131 10*3/uL — AB (ref 150–400)
RBC: 2.91 MIL/uL — AB (ref 4.22–5.81)
RDW: 16.9 % — ABNORMAL HIGH (ref 11.5–15.5)
WBC: 5.4 10*3/uL (ref 4.0–10.5)
nRBC: 0 % (ref 0.0–0.2)

## 2018-09-22 LAB — MAGNESIUM: Magnesium: 1.8 mg/dL (ref 1.7–2.4)

## 2018-09-22 MED ORDER — ALBUTEROL SULFATE HFA 108 (90 BASE) MCG/ACT IN AERS: 2.0000 | INHALATION_SPRAY | Freq: Four times a day (QID) | RESPIRATORY_TRACT | 2 refills | Status: DC | PRN

## 2018-09-22 MED ORDER — GUAIFENESIN-CODEINE 100-10 MG/5ML PO SOLN: 10.0000 mL | Freq: Four times a day (QID) | ORAL | 0 refills | Status: DC | PRN

## 2018-09-22 NOTE — Telephone Encounter (Signed)
apts has have been made.

## 2018-09-22 NOTE — Care Management Important Message (Signed)
Important Message  Patient Details  Name: Eugene Burton MRN: [id number] Date of Birth: 12/12/1960   Medicare Important Message Given:  Yes    Juliann Pulse A Pinkney Venard 09/22/2018, 11:09 AM

## 2018-09-22 NOTE — Discharge Summary (Addendum)
[place] [facility] [place]: Koury Roddy    MR#:  [id number]  [place]:  06-13-1961  DATE OF ADMISSION:  09/18/2018   ADMITTING PHYSICIAN: Arta Silence, MD  DATE OF DISCHARGE: 09/22/2018  PRIMARY CARE PHYSICIAN: Patient, No Pcp Per   ADMISSION DIAGNOSIS:  Dehydration [E86.0] Hyponatremia [E87.1] Metastatic cancer ([place]) [C79.9] DISCHARGE DIAGNOSIS:  Active Problems:   Metastatic cancer ([place])   Malnutrition of moderate degree  SECONDARY DIAGNOSIS:   Past Medical History:  Diagnosis Date  . Deaf    patient needs interpreter   . GERD (gastroesophageal reflux disease)   . Hypertension    HOSPITAL COURSE:  1.  Bilateral  metastatic lung disease Patient presented to the emergency room with complaints of a few day history of cough. CT scan of the chest in the emergency room revealed ill-defined masslike opacities scattered throughout both lungs with associated hilar and mediastinal lymphadenopathy concerning for metastatic disease.  CT scan of the abdomen also revealed pancreatic lesions concerning for pancreatic carcinoma as well as right hepatic lobe mass and cystic mass in the left kidney with other findings concerning for peritoneal carcinomatosis. Patient seen by oncologist who extensive discussion with patient and family members.  Decision was to proceed with biopsy of omental lesion by interventional radiology today Patient had CT-guided biopsy of peritoneal mass by interventional radiologist.  Follow-up with Dr.Brahmanday in clinic. Robitussin as needed for cough.  Acute respiratory failure with hypoxia due to above. The patient has hypoxia resolved oxygen by nasal cannula.  He is qualified for home oxygen 2 L by nasal cannula.  2.  Hyponatremia with sodium level of 128. May be related to underlying lung malignancy and dehydration.    He is treated with gentle IV fluid hydration with improvement in sodium level to  130. Hypokalemia; improved with potassium supplement.  3.  Anemia Likely due to anemia of chronic disease.  Follow-up as outpatient.  4.  Thrombocytopenia Platelet count stable at 131, stable.   Severe malnutrition.  Follow dietitian's recommendation.  Encourage oral intake.  Tobacco abuse.  Smoking cessation was counseled for 3-4 minutes. DISCHARGE CONDITIONS:  Stable, discharge to home. CONSULTS OBTAINED:  Treatment Team:  Arta Silence, MD DRUG ALLERGIES:  No Known Allergies DISCHARGE MEDICATIONS:   Allergies as of 09/22/2018   No Known Allergies     Medication List    STOP taking these medications   benzonatate 100 MG capsule Commonly known as:  TESSALON PERLES     TAKE these medications   albuterol 108 (90 Base) MCG/ACT inhaler Commonly known as:  PROVENTIL HFA;VENTOLIN HFA Inhale 2 puffs into the lungs every 6 (six) hours as needed for wheezing or shortness of breath.   guaiFENesin-codeine 100-10 MG/5ML syrup Take 10 mLs by mouth every 6 (six) hours as needed for cough.   losartan-hydrochlorothiazide 50-12.5 MG tablet Commonly known as:  HYZAAR Take 1 tablet by mouth daily.   nebivolol 5 MG tablet Commonly known as:  BYSTOLIC Take 1 tablet (5 mg total) by mouth daily.   ranitidine 150 MG tablet Commonly known as:  ZANTAC Take 1 tablet (150 mg total) by mouth 2 (two) times daily.            Durable Medical Equipment  (From admission, onward)         Start     Ordered   09/22/18 1051  For home use only DME oxygen  Once    Question Answer Comment  Mode or (Route) Nasal cannula   Liters per Minute 2   Frequency Continuous (stationary and portable oxygen unit needed)   Oxygen conserving device Yes   Oxygen delivery system Gas      09/22/18 1050           DISCHARGE INSTRUCTIONS:  See AVS.  If you experience worsening of your admission symptoms, develop shortness of breath, life threatening emergency, suicidal or homicidal thoughts  you must seek medical attention immediately by calling 911 or calling your MD immediately  if symptoms less severe.  You Must read complete instructions/literature along with all the possible adverse reactions/side effects for all the Medicines you take and that have been prescribed to you. Take any new Medicines after you have completely understood and accpet all the possible adverse reactions/side effects.   Please note  You were cared for by a hospitalist during your hospital stay. If you have any questions about your discharge medications or the care you received while you were in the hospital after you are discharged, you can call the unit and asked to speak with the hospitalist on call if the hospitalist that took care of you is not available. Once you are discharged, your primary care physician will handle any further medical issues. Please note that NO REFILLS for any discharge medications will be authorized once you are discharged, as it is imperative that you return to your primary care physician (or establish a relationship with a primary care physician if you do not have one) for your aftercare needs so that they can reassess your need for medications and monitor your lab values.    On the day of Discharge:  VITAL SIGNS:  Blood pressure (!) 151/81, pulse 98, temperature (!) 97.4 F (36.3 C), temperature source Oral, resp. rate 20, height 5\' 3"  (1.6 m), weight 51.3 kg, SpO2 92 %. PHYSICAL EXAMINATION:  GENERAL:  58 y.o.-year-old patient lying in the bed with no acute distress.  Severe malnutrition. EYES: Pupils equal, round, reactive to light and accommodation. No scleral icterus. Extraocular muscles intact.  HEENT: Head atraumatic, normocephalic. Oropharynx and nasopharynx clear. Deaf. NECK:  Supple, no jugular venous distention. No thyroid enlargement, no tenderness.  LUNGS: Normal breath sounds bilaterally, no wheezing, rales,rhonchi or crepitation. No use of accessory muscles of  respiration.  CARDIOVASCULAR: S1, S2 normal. No murmurs, rubs, or gallops.  ABDOMEN: Soft, non-tender, non-distended. Bowel sounds present. No organomegaly or mass.  EXTREMITIES: No pedal edema, cyanosis, or clubbing.  NEUROLOGIC: Cranial nerves II through XII are intact. Muscle strength 5/5 in all extremities. Sensation intact. Gait not checked.  PSYCHIATRIC: The patient is alert and oriented x 3.  SKIN: No obvious rash, lesion, or ulcer.  DATA REVIEW:   CBC Recent Labs  Lab 09/22/18 0526  WBC 5.4  HGB 7.8*  HCT 24.4*  PLT 131*    Chemistries  Recent Labs  Lab 09/18/18 1806  09/22/18 0526  NA 130*   < > 130*  K 3.2*   < > 3.5  CL 93*   < > 98  CO2 27   < > 24  GLUCOSE 172*   < > 133*  BUN 16   < > 10  CREATININE 0.88   < > 0.69  CALCIUM 8.6*   < > 8.1*  MG  --    < > 1.8  AST 24  --   --   ALT 17  --   --   ALKPHOS 279*  --   --  BILITOT 0.6  --   --    < > = values in this interval not displayed.     Microbiology Results  No results found for this or any previous visit.  RADIOLOGY:  No results found.  Helped by deaf interpreter via video. Management plans discussed with the patient, family and they are in agreement.  CODE STATUS: Full Code   TOTAL TIME TAKING CARE OF THIS PATIENT: 35 minutes.    Demetrios Loll M.D on 09/22/2018 at 12:20 PM  Between 7am to 6pm - Pager - [phone]  After 6pm go to [email or web address] - Proofreader  [facility]  Office  [phone]  CC: Primary care physician; Patient, No Pcp Per   Note: This dictation was prepared with Dragon dictation along with smaller phrase technology. Any transcriptional errors that result from this process are unintentional.

## 2018-09-22 NOTE — Care Management (Signed)
Discharge to home today per Dr. Bridgett Larsson. Qualified for home oxygen. Referral to [facility]. Family/friend will transport. Shelbie Ammons RN MSN CCM Care Management [phone]

## 2018-09-22 NOTE — Progress Notes (Signed)
[place]   DOB:[birth year]   XT#:[id number]    Subjective: Communication with the patient through sign language interpreter.   Patient complains of cough overnight.  Currently wearing oxygen.  Patient reluctant with wearing oxygen.  He complains of pain in his abdomen  /hips.  Patient had biopsy yesterday.  Procedure was uneventful.   Objective:  Vitals:   09/22/18 0900 09/22/18 0938  BP:    Pulse:    Resp:    Temp:    SpO2: (!) 89% 92%     Intake/Output Summary (Last 24 hours) at 09/22/2018 2235 Last data filed at 09/22/2018 6270 Gross per 24 hour  Intake 1036.35 ml  Output 300 ml  Net 736.35 ml    Physical Exam  Constitutional: He is oriented to person, place, and time.  Thin built cachectic male patient.  Resting comfortably.  HENT:  Head: Normocephalic and atraumatic.  Mouth/Throat: Oropharynx is clear and moist. No oropharyngeal exudate.  Eyes: Pupils are equal, round, and reactive to light.  Neck: Normal range of motion. Neck supple.  Cardiovascular: Normal rate and regular rhythm.  Pulmonary/Chest: No respiratory distress. He has no wheezes.  Decreased air entry.  Abdominal: Soft. Bowel sounds are normal. He exhibits no distension and no mass. There is no abdominal tenderness. There is no rebound and no guarding.  Musculoskeletal: Normal range of motion.        General: No tenderness or edema.  Neurological: He is alert and oriented to person, place, and time.  Skin: Skin is warm.  Psychiatric: Affect normal.     Labs:  Lab Results  Component Value Date   WBC 5.4 09/22/2018   HGB 7.8 (L) 09/22/2018   HCT 24.4 (L) 09/22/2018   MCV 83.8 09/22/2018   PLT 131 (L) 09/22/2018   NEUTROABS 5.5 06/01/2018    Lab Results  Component Value Date   NA 130 (L) 09/22/2018   K 3.5 09/22/2018   CL 98 09/22/2018   CO2 24 09/22/2018    Studies:  Ct Biopsy  Result Date: 09/21/2018 CLINICAL DATA:  Pancreatic mass, peritoneal masses, liver mass, left renal mass,  left adrenal mass and pulmonary masses. 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 Presenting for biopsy of one of the peritoneal masses. EXAM: CT GUIDED CORE BIOPSY OF PERITONEAL MASS ANESTHESIA/SEDATION: 2.0 mg IV Versed; 75 mcg IV Fentanyl Total Moderate Sedation Time:  42 minutes. The patient's level of consciousness and physiologic status were continuously monitored during the procedure by Radiology nursing. PROCEDURE: The procedure risks, benefits, and alternatives were explained to the patient. Questions regarding the procedure were encouraged and answered. The patient understands and consents to the procedure. A time-out was performed prior to initiating the procedure. The operative field was prepped with chlorhexidine in a sterile fashion, and a sterile drape was applied covering the operative field. A sterile gown and sterile gloves were used for the procedure. Local anesthesia was provided with 1% Lidocaine. CT was performed in a supine position through the mid to lower abdomen. A site was chosen for biopsy along the left abdominal wall. Under CT guidance, a 17 gauge trocar needle was advanced. After confirming needle tip position, a total of 4 coaxial 18 gauge core biopsy samples were obtained and submitted in formalin. Additional CT was performed after biopsy. COMPLICATIONS: None FINDINGS: Peritoneal soft tissue nodule in the left mid lateral peritoneal cavity was chosen for biopsy and measures approximately 2 cm in maximal diameter. Solid tissue was obtained. There were no immediate complications. IMPRESSION: CT-guided  core biopsy performed of peritoneal mass in the left mid lateral abdomen. Electronically Signed   By: Aletta Edouard M.D.   On: 09/21/2018 14:19    Metastatic cancer [facility]) # 58 year old male patient history of alcohol/smoking-currently admitted to the hospital cough/shortness of breath-on CT scan noted to have multiple lesions concerning for metastatic disease  #Pancreatic head  mass/colonic mass/liver lesion/adrenal lesion/mesenteric adenopathy/lung lesions/bilateral femoral lesion/cystic renal mass-primary malignancy either pancreatic versus colonic versus synchronous.  Patient currently status post omental biopsy; CA-19-9 elevated/CEA slightly elevated.  #Anemia-hemoglobin around 8-stable no obvious iron deficiency noted likely anemia of chronic disease; hemoglobin 7.3; if lower will plan blood transfusion next week.  #Cough/difficulty breathing-currently oxygen.  Suspect COPD.  Recommend Advair/albuterol discussed with Dr. Bridgett Larsson.  #Significant family history of cancer father mother sister-we will likely need genetic testing/however await above biopsy.  #Mild thrombocytopenia-alcohol versus others.  Monitor for now.  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  We will plan bisphosphonates at next visit.  #Discussed that we will plan outpatient follow-up on January 15 at 830 at the cancer center; appointments confirm with the patient's brother Ovid Curd over the phone.     Cammie Sickle, MD 09/22/2018  10:35 PM

## 2018-09-22 NOTE — Telephone Encounter (Signed)
Please schedule appt for jan 15th/wed at 8:30; needs sign language interpreter.  Please call brother- Ovid Curd re: appt.  Thanks GB

## 2018-09-22 NOTE — Discharge Instructions (Signed)
Smoking cessation  

## 2018-09-22 NOTE — Plan of Care (Signed)
Pt progress adequate for discharge. Translation services contacted two times during shift.  08:13 Translator, Tommi Emery # [id number], Provided translation service. To complete Shift assessment and discuss plan of care for today. Also during this session translator translated for attending physician and oncology physician. In order to discuss discharge instruction translator, Caren [phone], provided services covering discharge instruction, prescriptions follow appiontments

## 2018-09-22 NOTE — Progress Notes (Addendum)
SATURATION QUALIFICATIONS: (This note is used to comply with regulatory documentation for home oxygen)  Patient Saturations on Room Air at Rest = 87%  Patient Saturations on Room Air while Ambulating = 86%  Patient Saturations on 2 Liters of oxygen while Ambulating = 94 Please briefly explain why patient needs home oxygen: [facility]

## 2018-09-22 NOTE — Care Management Important Message (Signed)
Important Message  Patient Details  Name: ATANACIO MELNYK MRN: [id number] Date of Birth: Mar 13, 1961   Medicare Important Message Given:       Darius Bump Linden Mikes 09/22/2018, 10:33 AM

## 2018-09-25 ENCOUNTER — Other Ambulatory Visit: Payer: Self-pay | Admitting: Specialist

## 2018-09-25 LAB — SURGICAL PATHOLOGY

## 2018-09-27 ENCOUNTER — Telehealth: Payer: Self-pay | Admitting: Hospice and Palliative Medicine

## 2018-09-27 ENCOUNTER — Other Ambulatory Visit: Payer: Self-pay

## 2018-09-27 ENCOUNTER — Inpatient Hospital Stay: Payer: Medicare HMO | Attending: Internal Medicine | Admitting: Internal Medicine

## 2018-09-27 ENCOUNTER — Inpatient Hospital Stay (HOSPITAL_BASED_OUTPATIENT_CLINIC_OR_DEPARTMENT_OTHER): Payer: Medicare HMO | Admitting: Hospice and Palliative Medicine

## 2018-09-27 DIAGNOSIS — C184 Malignant neoplasm of transverse colon: Secondary | ICD-10-CM | POA: Insufficient documentation

## 2018-09-27 DIAGNOSIS — I10 Essential (primary) hypertension: Secondary | ICD-10-CM

## 2018-09-27 DIAGNOSIS — K869 Disease of pancreas, unspecified: Secondary | ICD-10-CM

## 2018-09-27 DIAGNOSIS — Z515 Encounter for palliative care: Secondary | ICD-10-CM | POA: Diagnosis not present

## 2018-09-27 DIAGNOSIS — E46 Unspecified protein-calorie malnutrition: Secondary | ICD-10-CM

## 2018-09-27 DIAGNOSIS — K769 Liver disease, unspecified: Secondary | ICD-10-CM

## 2018-09-27 DIAGNOSIS — C186 Malignant neoplasm of descending colon: Secondary | ICD-10-CM

## 2018-09-27 DIAGNOSIS — F17210 Nicotine dependence, cigarettes, uncomplicated: Secondary | ICD-10-CM

## 2018-09-27 DIAGNOSIS — N289 Disorder of kidney and ureter, unspecified: Secondary | ICD-10-CM | POA: Diagnosis not present

## 2018-09-27 DIAGNOSIS — R4789 Other speech disturbances: Secondary | ICD-10-CM

## 2018-09-27 DIAGNOSIS — H9190 Unspecified hearing loss, unspecified ear: Secondary | ICD-10-CM

## 2018-09-27 DIAGNOSIS — G219 Secondary parkinsonism, unspecified: Secondary | ICD-10-CM

## 2018-09-27 DIAGNOSIS — J9601 Acute respiratory failure with hypoxia: Secondary | ICD-10-CM | POA: Diagnosis not present

## 2018-09-27 DIAGNOSIS — J189 Pneumonia, unspecified organism: Secondary | ICD-10-CM | POA: Diagnosis not present

## 2018-09-27 DIAGNOSIS — R918 Other nonspecific abnormal finding of lung field: Secondary | ICD-10-CM

## 2018-09-27 DIAGNOSIS — J449 Chronic obstructive pulmonary disease, unspecified: Secondary | ICD-10-CM

## 2018-09-27 DIAGNOSIS — G893 Neoplasm related pain (acute) (chronic): Secondary | ICD-10-CM

## 2018-09-27 DIAGNOSIS — K219 Gastro-esophageal reflux disease without esophagitis: Secondary | ICD-10-CM

## 2018-09-27 DIAGNOSIS — Z7189 Other specified counseling: Secondary | ICD-10-CM

## 2018-09-27 MED ORDER — GUAIFENESIN-CODEINE 100-10 MG/5ML PO SOLN: 10.0000 mL | Freq: Four times a day (QID) | ORAL | 0 refills | Status: AC | PRN

## 2018-09-27 MED ORDER — NEBIVOLOL HCL 5 MG PO TABS: 5.0000 mg | ORAL_TABLET | Freq: Every day | ORAL | 3 refills | Status: AC

## 2018-09-27 MED ORDER — LOSARTAN POTASSIUM-HCTZ 50-12.5 MG PO TABS: 1.0000 | ORAL_TABLET | Freq: Every day | ORAL | 3 refills | Status: DC

## 2018-09-27 MED ORDER — HYDROCODONE-ACETAMINOPHEN 5-325 MG PO TABS: 1.0000 | ORAL_TABLET | Freq: Four times a day (QID) | ORAL | 0 refills | Status: DC | PRN

## 2018-09-27 MED ORDER — RANITIDINE HCL 150 MG PO TABS: 150.0000 mg | ORAL_TABLET | Freq: Two times a day (BID) | ORAL | 3 refills | Status: AC

## 2018-09-27 NOTE — Assessment & Plan Note (Addendum)
#  JAN 2020- transverse adeno colon ca; s/p omental biopsy.  No colonoscopy; however imaging also shows pancreatic mass approximately 4 to 5 cm in size [highly suggestive of primary pancreatic malignancy with elevated CA-19-9]; and also kidney mass/cyst.  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  The median life expectancy would be in order of 1 year with treatment.  # Discussed the potential side effects including but not limited to-increasing fatigue, nausea vomiting, diarrhea, hair loss, sores in the mouth, increase risk of infection and also neuropathy.  Understands treatments are palliative not curative.  #Malignancy related pain-recommend Vicodin 3 times a day as needed.  Will further readjust when is here for the next visit.   # hearing/ speech impaired/ active smoker/ COPD   # genetic counseling- check MMR-this might be helpful for the family.  We also discussed genetic counseling for family reasons.  #After lengthy discussion patient was not sure of his plans.  But felt that he was leaning towards avoiding treatments.  Hospice very reasonable given his multiple issues.  # [phone]/ [facility].  There was some concerns about patient's decision making capacity.  Await reevaluation with adult protection services.  # 40 minutes face-to-face with the patient discussing the above plan of care; more than 50% of time spent on prognosis/ natural history; counseling and coordination.discussed with Josh.    # DISPOSITION:  # Josh today if possible.  # follow up in 2 weeks- labs-cbc/holdtube/cmp-Dr.B

## 2018-09-27 NOTE — Patient Instructions (Signed)
To help establish care with a local primary care provider, please call [phone]

## 2018-09-27 NOTE — Progress Notes (Signed)
Met with Mr. Goettl along with his Education officer, museum and sign interpreter. Introduced Therapist, nutritional and provided contact information for future needs. We will assist with arranging a PCP after his visit with Dr. Rogue Bussing and palliative care if needed.

## 2018-09-27 NOTE — Telephone Encounter (Signed)
I spoke with Sammuel Bailiff, SW for DSS (office: [phone], cell: [phone]). She says she talked with her supervisor about a formal evaluation of Eugene Burton's capacity and guardianship. DSS plans to hold off for now on this as it is possible that patient is just overwhelmed with everything that is happening (e.g. cancer, wife dying, pending foreclosure of his home) and that those events might be affecting his comprehension. We agreed to follow up with him in two weeks to readdress goals. In the interim, I am sending a referral for home palliative care services.

## 2018-09-27 NOTE — Progress Notes (Addendum)
Hospital sign language interpreter utlized for patient care today. Social worker also with patient today. Patient is applying for guardianship papers. Social worker here to support patient in the goals of care. per social worker - Jaz there is some concern that patient's brother "nathan" may not be an adequate support for patient in clinical decision making process. Patient's best friend - Barnie Del (who is also deaf) checks on patient very frequently and helps him with stocking his daily medications. Patient is in w/c due to weakness and shortness of breath. Reports lower extremity edema-mild pitting edema present in bilateral extremities. Patient on continuous oxygen at 2 Liters.  Patient reports ongoing cough, intermittent nausea and vomiting. Also reports 2 episodes of diarrhea in the last 24 hours. Patient has dysphagia with solids.  Patient no longer has a pcp as provider. [facility]; however, he recently enrolled in health insurance and this free clinic is unable to provide care to him for this reason. Patient requires RFs on routine bp and cough medications in leu of fact that he does not have a pcp.
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
## 2018-09-27 NOTE — Progress Notes (Signed)
Eugene Burton  [phone] [phone]   Name: Eugene Burton Date: 09/27/2018 MRN: [id number]  DOB: [birth year]  Patient Care Team: Patient, No Pcp Per as PCP - General (General Practice) Clent Jacks, RN as Registered Nurse    REASON FOR CONSULTATION: Palliative Care consult requested for this 58 y.o. male with multiple medical problems including stage IV colon cancer, chronic respiratory failure requiring O2, deafness, malnutrition, GERD, and hypertension.  Patient was recently hospitalized 09/18/2018 to 09/22/2018 with cough and dehydration and was found to have multiple lung masses bilaterally on CT scan.  Abdominal CT revealed pancreatic lesions, liver lesions, a mass in the left kidney, and peritoneal carcinomatosis.  He underwent biopsy of the omental lesion with pathology showing metastatic adenocarcinoma likely of colorectal origin.  However, it is possible that patient has multiple primary cancers.  He was referred to palliative care to help address goals.   SOCIAL HISTORY:     reports that he has been smoking cigarettes. He has been smoking about 1.00 pack per day. He has never used smokeless tobacco. He reports that he does not drink alcohol or use drugs.   Patient lives at home alone.  He was recently referred to APS due to self-neglect.  His wife, who was his primary caregiver, died in 05/10/2018.  He has no children.  He has a brother who is not involved.  He has a sister, who is also deaf and lives and [place].  ADVANCE DIRECTIVES:  Does not have  CODE STATUS: Full code  PAST MEDICAL HISTORY: Past Medical History:  Diagnosis Date  . Deaf    patient needs interpreter   . GERD (gastroesophageal reflux disease)   . Hypertension     PAST SURGICAL HISTORY:  Past Surgical History:  Procedure Laterality Date  . broken leg    . 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  MEDICATIONS:  Current Outpatient Medications  Medication Sig Dispense Refill  . albuterol (PROVENTIL HFA;VENTOLIN HFA) 108 (90 Base) MCG/ACT inhaler Inhale 2 puffs into the lungs every 6 (six) hours as needed for wheezing or shortness of breath. 1 Inhaler 2  . guaiFENesin-codeine 100-10 MG/5ML syrup Take 10 mLs by mouth every 6 (six) hours as needed for cough. 118 mL 0  . HYDROcodone-acetaminophen (NORCO/VICODIN) 5-325 MG tablet Take 1 tablet by mouth every 6 (six) hours as needed for moderate pain. 45 tablet 0  . losartan-hydrochlorothiazide (HYZAAR) 50-12.5 MG tablet Take 1 tablet by mouth daily. 30 tablet 3  . nebivolol (BYSTOLIC) 5 MG tablet Take 1 tablet (5 mg total) by mouth daily. 30 tablet 3  . ranitidine (ZANTAC) 150 MG tablet Take 1 tablet (150 mg total) by mouth 2 (two) times daily. 60 tablet 3   No current facility-administered medications for this visit.     VITAL SIGNS: There were no vitals taken for this visit. There were no vitals filed for this visit.  Estimated body mass index is 20.02 kg/m as calculated from the  following:   Height as of 09/18/18: 5' 3"  (1.6 m).   Weight as of 09/18/18: 113 lb (51.3 kg).  LABS: CBC:    Component Value Date/Time   WBC 5.4 09/22/2018 0526   HGB 7.8 (L) 09/22/2018 0526   HGB 12.0 (L) 06/01/2018 1940   HCT 24.4 (L) 09/22/2018 0526   HCT 36.7 (L) 06/01/2018 1940   PLT 131 (L) 09/22/2018 0526   PLT 382 06/01/2018 1940   MCV 83.8 09/22/2018 0526   MCV 88 06/01/2018 1940   NEUTROABS 5.5 06/01/2018 1940   LYMPHSABS 1.6 06/01/2018 1940   EOSABS  0.1 06/01/2018 1940   BASOSABS 0.0 06/01/2018 1940   Comprehensive Metabolic Panel:    Component Value Date/Time   NA 130 (L) 09/22/2018 0526   NA 141 06/01/2018 1940   K 3.5 09/22/2018 0526   CL 98 09/22/2018 0526   CO2 24 09/22/2018 0526   BUN 10 09/22/2018 0526   BUN 11 06/01/2018 1940   CREATININE 0.69 09/22/2018 0526   GLUCOSE 133 (H) 09/22/2018 0526   CALCIUM 8.1 (L) 09/22/2018 0526   AST 24 09/18/2018 1806   ALT 17 09/18/2018 1806   ALKPHOS 279 (H) 09/18/2018 1806   BILITOT 0.6 09/18/2018 1806   BILITOT 0.3 06/01/2018 1940   PROT 6.3 (L) 09/18/2018 1806   PROT 6.5 06/01/2018 1940   ALBUMIN 3.0 (L) 09/18/2018 1806   ALBUMIN 4.1 06/01/2018 1940    RADIOGRAPHIC STUDIES: Dg Chest 2 View  Result Date: 09/18/2018 CLINICAL DATA:  Cough. Weight loss, patient has been feeling poorly for 3 months. EXAM: CHEST - 2 VIEW COMPARISON:  Remote chest radiograph 04/07/2008 FINDINGS: Heart is normal in size. Diffuse peribronchial and interstitial thickening. Slight bilateral hilar prominence. No confluent airspace disease or radiographic findings of mass. Minimal fluid in the fissures without subpulmonic effusion. No acute osseous abnormalities are seen. IMPRESSION: 1. Diffuse peribronchial interstitial thickening with diagnostic considerations of pulmonary edema or bronchitis. 2. Mild bilateral hilar prominence is nonspecific, may be vascular overlap versus adenopathy. Consider further evaluation with chest CT with contrast for further evaluation. 3. No radiographic findings of pneumonia or pulmonary mass. Electronically Signed   By: Keith Rake M.D.   On: 09/18/2018 22:39   Ct Chest W Contrast  Result Date: 09/18/2018 EXAM: CT CHEST, ABDOMEN, AND PELVIS WITH CONTRAST TECHNIQUE: Multidetector CT imaging of the chest, abdomen and pelvis was performed following the standard protocol during bolus administration of intravenous contrast. CONTRAST:  46m OMNIPAQUE IOHEXOL 300 MG/ML  SOLN  COMPARISON:  Same day CXR FINDINGS: CT CHEST FINDINGS Cardiovascular: Conventional branch pattern of the great vessels. Nonaneurysmal thoracic aorta. No large central pulmonary embolus. Heart size is top normal without pericardial effusion or thickening. Mediastinum/Nodes: Mild thyromegaly with retroclavicular extension of the thyroid gland. No dominant mass. Midline patent trachea. Mainstem bronchi appear patent. Mild enlargement of mediastinal lymph nodes in the right upper paratracheal, prevascular, left lower paratracheal and bilateral hilar lymph node stations ranging in size from 10 mm through 12 mm. The CT appearance of the esophagus is unremarkable. Lungs/Pleura: Interstitial thickening likely representing interstitial edema with ill-defined patchy multilobar masslike bilateral airspace opacities and peribronchial thickening are identified bilaterally. The largest is noted in the superior segment of left lower lobe measuring approximately 2 x 2.4 x 1.8 cm with smaller ill-defined nodular opacities scattered throughout both lungs. No effusion or pneumothorax. Musculoskeletal: No chest wall mass or suspicious bone lesions identified. CT ABDOMEN PELVIS FINDINGS Hepatobiliary: Concerning ill-defined hypodense lesion in the  posterior segment of right hepatic lobe measuring 4.1 x 3.7 x 4.7 cm in transverse by AP by craniocaudad suspicious for hepatic metastasis. No biliary dilatation. Nondistended gallbladder containing a 1.6 cm gallstone. Pancreas: Hypodense ill-defined mass measuring 4.8 x 4.5 x 4.4 cm centered within the body of the pancreas concerning for pancreatic carcinoma. No ductal dilatation. Patent portal and splenic veins. Spleen: Normal Adrenals/Urinary Tract: Bilobed hypodense masslike abnormality in the left adrenal gland likely representing metastatic disease measuring at least 3.8 x 1.6 cm, series 2/49. Exophytic complex cystic mass arising lower pole of the left kidney measuring 4.5 x 3.6 x 5 cm  with mural nodule along the caudal aspect measuring up to 1.2 x 1.5 x 1.8 cm. Hypodense lesions of the right kidney are more likely to represent simple cysts though some are too small to further characterize. The largest is in the lower pole posteriorly measuring 1.8 cm in diameter. Stomach/Bowel: The stomach is nondistended. Normal small bowel rotation. Slight fluid-filled distention of distal and mid small bowel loops without mechanical obstruction. Normal appendix, terminal and distal ileum. Moderate transmural thickening of the transverse colon with ill-defined masslike abnormality involving the distal transverse colon measuring 5.2 x 7.4 x 2.9 cm, series 2/59 with single wall thickness up to 2.4 cm. There are adjacent peritoneal masses concerning for peritoneal carcinomatosis measuring 1.7 cm in the left hemiabdomen, series 2/60 with smaller mesenteric nodules in the left upper quadrant measuring up to 0.8 cm and 1.4 cm. Mesenteric edema and mesenteric nodules are also noted further caudad within the left hemiabdomen, the largest measuring up to 2 cm, series 2/68. Smaller left pelvic mesenteric nodule measuring 0.8 cm is seen on the left. Vascular/Lymphatic: Mild aortoiliac atherosclerosis without aneurysm or dissection. No retroperitoneal or definite mesenteric adenopathy. Reproductive: Prostate is unremarkable. Other: No free air or free fluid. Musculoskeletal: Suspicious osteolytic appearance of the right femoral head on axial image 107, series 2. A more circumscribed left femoral neck lesion measuring up to 1.5 cm with ill-defined right posterior iliac bone 2.1 cm lesion. IMPRESSION: Chest CT: 1. Ill-defined masslike opacities scattered throughout both lungs with associated hilar and mediastinal lymphadenopathy concerning for metastatic disease. Given the masslike abnormalities noted within the abdomen involving the pancreas and transverse colon, suspect that these are more likely metastatic and not due to  a primary pulmonary neoplasm. 2. Nonaneurysmal thoracic aorta. 3. No acute pulmonary embolus. CT AP: 1. Hypodense mass centered within the body of the pancreas measuring 4.8 x 4.5 x 4.4 cm concerning for pancreatic carcinoma. 2. Masslike abnormality of the distal transverse colon measuring 5.2 x 7.4 x 2.9 cm with single wall thickness up to 2.4 cm. No bowel obstruction. This is also concerning for colonic neoplasm. 3. Hypodense mass of the right hepatic lobe measuring 4.1 x 3.7 x 4.7 cm concerning for hepatic metastasis. 4. Cystic mass arising off the lower pole of the left kidney with solid nodular component measuring 4.5 x 3.6 x 5 cm with the mural nodule measuring 1.2 x 1.5 x 1.8 cm. Cystic renal cell carcinoma is not excluded. 5. Findings concerning for peritoneal carcinomatosis with mesenteric nodules as above described. No ascites however is identified. 6. 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 These results were called by telephone at the time of interpretation on 09/18/2018 at 11:58 pm to Dr. Mable Paris , who verbally acknowledged these results. Electronically Signed   By: Ashley Royalty M.D.   On: 09/18/2018 23:58   Ct Abdomen Pelvis  W Contrast  Result Date: 09/18/2018 EXAM: CT CHEST, ABDOMEN, AND PELVIS WITH CONTRAST TECHNIQUE: Multidetector CT imaging of the chest, abdomen and pelvis was performed following the standard protocol during bolus administration of intravenous contrast. CONTRAST:  54m OMNIPAQUE IOHEXOL 300 MG/ML  SOLN COMPARISON:  Same day CXR FINDINGS: CT CHEST FINDINGS Cardiovascular: Conventional branch pattern of the great vessels. Nonaneurysmal thoracic aorta. No large central pulmonary embolus. Heart size is top normal without pericardial effusion or thickening. Mediastinum/Nodes: Mild thyromegaly with retroclavicular extension of the thyroid gland. No dominant mass. Midline patent trachea. Mainstem bronchi appear patent. Mild enlargement of mediastinal lymph  nodes in the right upper paratracheal, prevascular, left lower paratracheal and bilateral hilar lymph node stations ranging in size from 10 mm through 12 mm. The CT appearance of the esophagus is unremarkable. Lungs/Pleura: Interstitial thickening likely representing interstitial edema with ill-defined patchy multilobar masslike bilateral airspace opacities and peribronchial thickening are identified bilaterally. The largest is noted in the superior segment of left lower lobe measuring approximately 2 x 2.4 x 1.8 cm with smaller ill-defined nodular opacities scattered throughout both lungs. No effusion or pneumothorax. Musculoskeletal: No chest wall mass or suspicious bone lesions identified. CT ABDOMEN PELVIS FINDINGS Hepatobiliary: Concerning ill-defined hypodense lesion in the posterior segment of right hepatic lobe measuring 4.1 x 3.7 x 4.7 cm in transverse by AP by craniocaudad suspicious for hepatic metastasis. No biliary dilatation. Nondistended gallbladder containing a 1.6 cm gallstone. Pancreas: Hypodense ill-defined mass measuring 4.8 x 4.5 x 4.4 cm centered within the body of the pancreas concerning for pancreatic carcinoma. No ductal dilatation. Patent portal and splenic veins. Spleen: Normal Adrenals/Urinary Tract: Bilobed hypodense masslike abnormality in the left adrenal gland likely representing metastatic disease measuring at least 3.8 x 1.6 cm, series 2/49. Exophytic complex cystic mass arising lower pole of the left kidney measuring 4.5 x 3.6 x 5 cm with mural nodule along the caudal aspect measuring up to 1.2 x 1.5 x 1.8 cm. Hypodense lesions of the right kidney are more likely to represent simple cysts though some are too small to further characterize. The largest is in the lower pole posteriorly measuring 1.8 cm in diameter. Stomach/Bowel: The stomach is nondistended. Normal small bowel rotation. Slight fluid-filled distention of distal and mid small bowel loops without mechanical obstruction.  Normal appendix, terminal and distal ileum. Moderate transmural thickening of the transverse colon with ill-defined masslike abnormality involving the distal transverse colon measuring 5.2 x 7.4 x 2.9 cm, series 2/59 with single wall thickness up to 2.4 cm. There are adjacent peritoneal masses concerning for peritoneal carcinomatosis measuring 1.7 cm in the left hemiabdomen, series 2/60 with smaller mesenteric nodules in the left upper quadrant measuring up to 0.8 cm and 1.4 cm. Mesenteric edema and mesenteric nodules are also noted further caudad within the left hemiabdomen, the largest measuring up to 2 cm, series 2/68. Smaller left pelvic mesenteric nodule measuring 0.8 cm is seen on the left. Vascular/Lymphatic: Mild aortoiliac atherosclerosis without aneurysm or dissection. No retroperitoneal or definite mesenteric adenopathy. Reproductive: Prostate is unremarkable. Other: No free air or free fluid. Musculoskeletal: Suspicious osteolytic appearance of the right femoral head on axial image 107, series 2. A more circumscribed left femoral neck lesion measuring up to 1.5 cm with ill-defined right posterior iliac bone 2.1 cm lesion. IMPRESSION: Chest CT: 1. Ill-defined masslike opacities scattered throughout both lungs with associated hilar and mediastinal lymphadenopathy concerning for metastatic disease. Given the masslike abnormalities noted within the abdomen involving the pancreas and transverse colon,  suspect that these are more likely metastatic and not due to a primary pulmonary neoplasm. 2. Nonaneurysmal thoracic aorta. 3. No acute pulmonary embolus. CT AP: 1. Hypodense mass centered within the body of the pancreas measuring 4.8 x 4.5 x 4.4 cm concerning for pancreatic carcinoma. 2. Masslike abnormality of the distal transverse colon measuring 5.2 x 7.4 x 2.9 cm with single wall thickness up to 2.4 cm. No bowel obstruction. This is also concerning for colonic neoplasm. 3. Hypodense mass of the right  hepatic lobe measuring 4.1 x 3.7 x 4.7 cm concerning for hepatic metastasis. 4. Cystic mass arising off the lower pole of the left kidney with solid nodular component measuring 4.5 x 3.6 x 5 cm with the mural nodule measuring 1.2 x 1.5 x 1.8 cm. Cystic renal cell carcinoma is not excluded. 5. Findings concerning for peritoneal carcinomatosis with mesenteric nodules as above described. No ascites however is identified. 6. 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 These results were called by telephone at the time of interpretation on 09/18/2018 at 11:58 pm to Dr. Mable Paris , who verbally acknowledged these results. Electronically Signed   By: Ashley Royalty M.D.   On: 09/18/2018 23:58   Ct Biopsy  Result Date: 09/21/2018 CLINICAL DATA:  Pancreatic mass, peritoneal masses, liver mass, left renal mass, left adrenal mass and pulmonary masses. 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 Presenting for biopsy of one of the peritoneal masses. EXAM: CT GUIDED CORE BIOPSY OF PERITONEAL MASS ANESTHESIA/SEDATION: 2.0 mg IV Versed; 75 mcg IV Fentanyl Total Moderate Sedation Time:  42 minutes. The patient's level of consciousness and physiologic status were continuously monitored during the procedure by Radiology nursing. PROCEDURE: The procedure risks, benefits, and alternatives were explained to the patient. Questions regarding the procedure were encouraged and answered. The patient understands and consents to the procedure. A time-out was performed prior to initiating the procedure. The operative field was prepped with chlorhexidine in a sterile fashion, and a sterile drape was applied covering the operative field. A sterile gown and sterile gloves were used for the procedure. Local anesthesia was provided with 1% Lidocaine. CT was performed in a supine position through the mid to lower abdomen. A site was chosen for biopsy along the left abdominal wall. Under CT guidance, a 17 gauge trocar needle was  advanced. After confirming needle tip position, a total of 4 coaxial 18 gauge core biopsy samples were obtained and submitted in formalin. Additional CT was performed after biopsy. COMPLICATIONS: None FINDINGS: Peritoneal soft tissue nodule in the left mid lateral peritoneal cavity was chosen for biopsy and measures approximately 2 cm in maximal diameter. Solid tissue was obtained. There were no immediate complications. IMPRESSION: CT-guided core biopsy performed of peritoneal mass in the left mid lateral abdomen. Electronically Signed   By: Aletta Edouard M.D.   On: 09/21/2018 14:19    PERFORMANCE STATUS (ECOG) : 3 - Symptomatic, >50% confined to bed  Review of Systems As noted above. Otherwise, a complete review of systems is negative.  Physical Exam General: NAD, frail appearing, thin, in wheelchair Pulmonary: Unlabored, on O2 Extremities: no edema, no joint deformities Skin: no rashes Neurological: Weakness but otherwise nonfocal  IMPRESSION: I met today with patient using an interpreter.  Patient was accompanied by his caseworker from [facility].  Patient saw Dr. Rogue Bussing earlier today for consideration of treatment versus hospice care.  Using the interpreter, I attempted to elicit patient's understanding of his health conditions.  He did seem to know  that he was at the doctor's office and has a problem in his abdomen but his overall comprehension seems to be incredibly poor.  I repeatedly asked patient to summarize what I told him but he could not do this and would just shrugged his shoulders.  He was unable to identify the year but did know that it was January.  I question if patient has capacity for medical decision-making.  Formal evaluation of his capacity is made further difficult by his communication deficits.  An APS referral was made several months ago due to self-neglect following the death of his wife.  Apparently she was patient's primary support and caregiver.  Patient has had some  difficulty in caring for himself in the home.  It sounds like he is able to bathe and dress himself but is closely monitored by APS with consideration for additional resources.  They are currently discussing the need for placement and guardianship.  Patient was scheduled for an evaluation of capacity by DSS but his hospitalization interfered with that.  DSS plans to expedite their evaluation of capacity, guardianship, and possible need for placement.  I was unable to really elicit from patient his understanding of his current health problems or goals.  Treatment options likely would be impacted by his poor performance status and lack of social support.  We did discuss the option of hospice in detail.  However, I think any decisions likely will need to be delayed until guardianship status is determined by DSS.  PLAN: Will follow up with DSS case worker to clarify decision making   Time Total: 30 minutes  Visit consisted of counseling and education dealing with the complex and emotionally intense issues of symptom management and palliative care in the setting of serious and potentially life-threatening illness.Greater than 50%  of this time was spent counseling and coordinating care related to the above assessment and plan.  Signed by: Altha Harm, PhD, NP-C [phone] (Work Cell)

## 2018-09-28 ENCOUNTER — Inpatient Hospital Stay
Admission: EM | Admit: 2018-09-28 | Discharge: 2018-10-05 | DRG: 193 | Disposition: A | Discharge status: Skilled Nursing Facility | Payer: Medicare HMO | Attending: Internal Medicine | Admitting: Internal Medicine

## 2018-09-28 ENCOUNTER — Encounter: Payer: Self-pay | Admitting: Emergency Medicine

## 2018-09-28 ENCOUNTER — Other Ambulatory Visit: Payer: Self-pay

## 2018-09-28 ENCOUNTER — Telehealth: Payer: Self-pay | Admitting: *Deleted

## 2018-09-28 ENCOUNTER — Other Ambulatory Visit: Payer: Medicare HMO

## 2018-09-28 ENCOUNTER — Emergency Department: Payer: Medicare HMO

## 2018-09-28 DIAGNOSIS — Z981 Arthrodesis status: Secondary | ICD-10-CM

## 2018-09-28 DIAGNOSIS — E43 Unspecified severe protein-calorie malnutrition: Secondary | ICD-10-CM | POA: Diagnosis present

## 2018-09-28 DIAGNOSIS — J9621 Acute and chronic respiratory failure with hypoxia: Secondary | ICD-10-CM | POA: Diagnosis not present

## 2018-09-28 DIAGNOSIS — Z808 Family history of malignant neoplasm of other organs or systems: Secondary | ICD-10-CM

## 2018-09-28 DIAGNOSIS — G893 Neoplasm related pain (acute) (chronic): Secondary | ICD-10-CM | POA: Diagnosis present

## 2018-09-28 DIAGNOSIS — J440 Chronic obstructive pulmonary disease with acute lower respiratory infection: Secondary | ICD-10-CM | POA: Diagnosis present

## 2018-09-28 DIAGNOSIS — I5043 Acute on chronic combined systolic (congestive) and diastolic (congestive) heart failure: Secondary | ICD-10-CM | POA: Diagnosis present

## 2018-09-28 DIAGNOSIS — E876 Hypokalemia: Secondary | ICD-10-CM | POA: Diagnosis present

## 2018-09-28 DIAGNOSIS — Z515 Encounter for palliative care: Secondary | ICD-10-CM | POA: Diagnosis present

## 2018-09-28 DIAGNOSIS — D649 Anemia, unspecified: Secondary | ICD-10-CM | POA: Diagnosis present

## 2018-09-28 DIAGNOSIS — I110 Hypertensive heart disease with heart failure: Secondary | ICD-10-CM | POA: Diagnosis present

## 2018-09-28 DIAGNOSIS — F17210 Nicotine dependence, cigarettes, uncomplicated: Secondary | ICD-10-CM | POA: Diagnosis not present

## 2018-09-28 DIAGNOSIS — Z681 Body mass index (BMI) 19 or less, adult: Secondary | ICD-10-CM

## 2018-09-28 DIAGNOSIS — J9600 Acute respiratory failure, unspecified whether with hypoxia or hypercapnia: Secondary | ICD-10-CM | POA: Diagnosis not present

## 2018-09-28 DIAGNOSIS — J449 Chronic obstructive pulmonary disease, unspecified: Secondary | ICD-10-CM | POA: Diagnosis not present

## 2018-09-28 DIAGNOSIS — J9622 Acute and chronic respiratory failure with hypercapnia: Secondary | ICD-10-CM | POA: Diagnosis present

## 2018-09-28 DIAGNOSIS — K219 Gastro-esophageal reflux disease without esophagitis: Secondary | ICD-10-CM | POA: Diagnosis present

## 2018-09-28 DIAGNOSIS — E871 Hypo-osmolality and hyponatremia: Secondary | ICD-10-CM | POA: Diagnosis present

## 2018-09-28 DIAGNOSIS — Z66 Do not resuscitate: Secondary | ICD-10-CM | POA: Diagnosis not present

## 2018-09-28 DIAGNOSIS — C7800 Secondary malignant neoplasm of unspecified lung: Secondary | ICD-10-CM | POA: Diagnosis present

## 2018-09-28 DIAGNOSIS — C259 Malignant neoplasm of pancreas, unspecified: Secondary | ICD-10-CM | POA: Diagnosis present

## 2018-09-28 DIAGNOSIS — J9601 Acute respiratory failure with hypoxia: Secondary | ICD-10-CM

## 2018-09-28 DIAGNOSIS — Z9981 Dependence on supplemental oxygen: Secondary | ICD-10-CM

## 2018-09-28 DIAGNOSIS — C786 Secondary malignant neoplasm of retroperitoneum and peritoneum: Secondary | ICD-10-CM | POA: Diagnosis present

## 2018-09-28 DIAGNOSIS — J189 Pneumonia, unspecified organism: Secondary | ICD-10-CM | POA: Diagnosis present

## 2018-09-28 DIAGNOSIS — J441 Chronic obstructive pulmonary disease with (acute) exacerbation: Secondary | ICD-10-CM | POA: Diagnosis present

## 2018-09-28 DIAGNOSIS — E222 Syndrome of inappropriate secretion of antidiuretic hormone: Secondary | ICD-10-CM | POA: Diagnosis present

## 2018-09-28 DIAGNOSIS — C184 Malignant neoplasm of transverse colon: Secondary | ICD-10-CM | POA: Diagnosis present

## 2018-09-28 DIAGNOSIS — N2889 Other specified disorders of kidney and ureter: Secondary | ICD-10-CM | POA: Diagnosis present

## 2018-09-28 DIAGNOSIS — I158 Other secondary hypertension: Secondary | ICD-10-CM

## 2018-09-28 DIAGNOSIS — R64 Cachexia: Secondary | ICD-10-CM | POA: Diagnosis present

## 2018-09-28 DIAGNOSIS — H913 Deaf nonspeaking, not elsewhere classified: Secondary | ICD-10-CM | POA: Diagnosis present

## 2018-09-28 HISTORY — DX: Dyspnea, unspecified: R06.00

## 2018-09-28 HISTORY — DX: Chronic obstructive pulmonary disease, unspecified: J44.9

## 2018-09-28 LAB — BRAIN NATRIURETIC PEPTIDE: B Natriuretic Peptide: 307 pg/mL — ABNORMAL HIGH (ref 0.0–100.0)

## 2018-09-28 LAB — CBC WITH DIFFERENTIAL/PLATELET
ABS IMMATURE GRANULOCYTES: 0.12 10*3/uL — AB (ref 0.00–0.07)
BASOS PCT: 0 %
Basophils Absolute: 0 10*3/uL (ref 0.0–0.1)
Eosinophils Absolute: 0.2 10*3/uL (ref 0.0–0.5)
Eosinophils Relative: 2 %
HCT: 26.8 % — ABNORMAL LOW (ref 39.0–52.0)
Hemoglobin: 9 g/dL — ABNORMAL LOW (ref 13.0–17.0)
Immature Granulocytes: 1 %
Lymphocytes Relative: 6 %
Lymphs Abs: 0.9 10*3/uL (ref 0.7–4.0)
MCH: 27.6 pg (ref 26.0–34.0)
MCHC: 33.6 g/dL (ref 30.0–36.0)
MCV: 82.2 fL (ref 80.0–100.0)
MONOS PCT: 9 %
Monocytes Absolute: 1.2 10*3/uL — ABNORMAL HIGH (ref 0.1–1.0)
Neutro Abs: 10.9 10*3/uL — ABNORMAL HIGH (ref 1.7–7.7)
Neutrophils Relative %: 82 %
Platelets: 214 10*3/uL (ref 150–400)
RBC: 3.26 MIL/uL — ABNORMAL LOW (ref 4.22–5.81)
RDW: 17.9 % — ABNORMAL HIGH (ref 11.5–15.5)
WBC: 13.4 10*3/uL — ABNORMAL HIGH (ref 4.0–10.5)
nRBC: 0 % (ref 0.0–0.2)

## 2018-09-28 LAB — TROPONIN I: Troponin I: <0.03 ng/mL (ref <0.03)

## 2018-09-28 LAB — BLOOD GAS, VENOUS
Acid-Base Excess: 4.9 mmol/L — ABNORMAL HIGH (ref 0.0–2.0)
Bicarbonate: 31.4 mmol/L — ABNORMAL HIGH (ref 20.0–28.0)
O2 Saturation: 83.4 %
PCO2 VEN: 53 mmHg (ref 44.0–60.0)
Patient temperature: 37
pH, Ven: 7.38 (ref 7.250–7.430)
pO2, Ven: 49 mmHg — ABNORMAL HIGH (ref 32.0–45.0)

## 2018-09-28 LAB — MAGNESIUM: Magnesium: 1.7 mg/dL (ref 1.7–2.4)

## 2018-09-28 LAB — BASIC METABOLIC PANEL
Anion gap: 9 (ref 5–15)
BUN: 10 mg/dL (ref 6–20)
CO2: 29 mmol/L (ref 22–32)
Calcium: 8.6 mg/dL — ABNORMAL LOW (ref 8.9–10.3)
Chloride: 91 mmol/L — ABNORMAL LOW (ref 98–111)
Creatinine, Ser: 0.53 mg/dL — ABNORMAL LOW (ref 0.61–1.24)
GFR calc Af Amer: >60 mL/min (ref >60)
GFR calc non Af Amer: >60 mL/min (ref >60)
Glucose, Bld: 132 mg/dL — ABNORMAL HIGH (ref 70–99)
Potassium: 3.3 mmol/L — ABNORMAL LOW (ref 3.5–5.1)
Sodium: 129 mmol/L — ABNORMAL LOW (ref 135–145)

## 2018-09-28 LAB — MRSA PCR SCREENING: MRSA by PCR: NEGATIVE

## 2018-09-28 MED ORDER — FUROSEMIDE 10 MG/ML IJ SOLN
40.0000 mg | Freq: Two times a day (BID) | INTRAMUSCULAR | Status: DC
  Administered 2018-09-28 – 2018-10-01 (×7): 40 mg via INTRAVENOUS
  Filled 2018-09-28 (×7): qty 4

## 2018-09-28 MED ORDER — NEBIVOLOL HCL 5 MG PO TABS
5.0000 mg | ORAL_TABLET | Freq: Every day | ORAL | Status: DC
  Administered 2018-09-29 – 2018-10-03 (×5): 5 mg via ORAL
  Filled 2018-09-28 (×7): qty 1

## 2018-09-28 MED ORDER — ALBUTEROL SULFATE (2.5 MG/3ML) 0.083% IN NEBU: 2.5000 mg | INHALATION_SOLUTION | RESPIRATORY_TRACT | Status: DC | PRN

## 2018-09-28 MED ORDER — ONDANSETRON HCL 4 MG/2ML IJ SOLN: 4.0000 mg | Freq: Four times a day (QID) | INTRAMUSCULAR | Status: DC | PRN

## 2018-09-28 MED ORDER — METHYLPREDNISOLONE SODIUM SUCC 125 MG IJ SOLR
80.0000 mg | Freq: Once | INTRAMUSCULAR | Status: AC
  Administered 2018-09-28: 80 mg via INTRAVENOUS
  Filled 2018-09-28: qty 2

## 2018-09-28 MED ORDER — HYDRALAZINE HCL 20 MG/ML IJ SOLN: 10.0000 mg | Freq: Four times a day (QID) | INTRAMUSCULAR | Status: DC | PRN

## 2018-09-28 MED ORDER — BISACODYL 5 MG PO TBEC: 5.0000 mg | DELAYED_RELEASE_TABLET | Freq: Every day | ORAL | Status: DC | PRN

## 2018-09-28 MED ORDER — MORPHINE SULFATE (PF) 2 MG/ML IV SOLN
1.0000 mg | INTRAVENOUS | Status: DC | PRN
  Administered 2018-09-28 – 2018-09-30 (×2): 2 mg via INTRAVENOUS
  Filled 2018-09-28 (×2): qty 1

## 2018-09-28 MED ORDER — PNEUMOCOCCAL VAC POLYVALENT 25 MCG/0.5ML IJ INJ: 0.5000 mL | INJECTION | INTRAMUSCULAR | Status: DC

## 2018-09-28 MED ORDER — ACETAMINOPHEN 325 MG PO TABS: 650.0000 mg | ORAL_TABLET | Freq: Four times a day (QID) | ORAL | Status: DC | PRN

## 2018-09-28 MED ORDER — NITROGLYCERIN IN D5W 200-5 MCG/ML-% IV SOLN
50.0000 ug/min | INTRAVENOUS | Status: DC
  Administered 2018-09-28: 50 ug/min via INTRAVENOUS
  Filled 2018-09-28: qty 250

## 2018-09-28 MED ORDER — SENNOSIDES-DOCUSATE SODIUM 8.6-50 MG PO TABS: 1.0000 | ORAL_TABLET | Freq: Every evening | ORAL | Status: DC | PRN

## 2018-09-28 MED ORDER — ONDANSETRON HCL 4 MG PO TABS: 4.0000 mg | ORAL_TABLET | Freq: Four times a day (QID) | ORAL | Status: DC | PRN

## 2018-09-28 MED ORDER — VANCOMYCIN HCL 10 G IV SOLR
1250.0000 mg | INTRAVENOUS | Status: DC
  Administered 2018-09-28: 1250 mg via INTRAVENOUS
  Filled 2018-09-28: qty 1250

## 2018-09-28 MED ORDER — HYDROCODONE-ACETAMINOPHEN 5-325 MG PO TABS
1.0000 | ORAL_TABLET | ORAL | Status: DC | PRN
  Administered 2018-10-01: 22:00:00 2 via ORAL
  Administered 2018-10-02: 1 via ORAL
  Filled 2018-09-28: qty 2
  Filled 2018-09-28: qty 1

## 2018-09-28 MED ORDER — ACETAMINOPHEN 650 MG RE SUPP: 650.0000 mg | Freq: Four times a day (QID) | RECTAL | Status: DC | PRN

## 2018-09-28 MED ORDER — SODIUM CHLORIDE 0.9% FLUSH
3.0000 mL | Freq: Two times a day (BID) | INTRAVENOUS | Status: DC
  Administered 2018-09-28 – 2018-10-05 (×13): 3 mL via INTRAVENOUS

## 2018-09-28 MED ORDER — MORPHINE SULFATE (PF) 4 MG/ML IV SOLN: 4.0000 mg | INTRAVENOUS | Status: DC | PRN

## 2018-09-28 MED ORDER — SODIUM CHLORIDE 0.9 % IV SOLN
250.0000 mL | INTRAVENOUS | Status: DC | PRN
  Administered 2018-09-30 – 2018-10-01 (×2): 250 mL via INTRAVENOUS
  Administered 2018-10-01 – 2018-10-02 (×2): 20 mL via INTRAVENOUS

## 2018-09-28 MED ORDER — GUAIFENESIN-CODEINE 100-10 MG/5ML PO SOLN
10.0000 mL | Freq: Four times a day (QID) | ORAL | Status: DC | PRN
  Administered 2018-09-29: 10 mL via ORAL
  Filled 2018-09-28: qty 10

## 2018-09-28 MED ORDER — FAMOTIDINE 20 MG PO TABS
20.0000 mg | ORAL_TABLET | Freq: Every day | ORAL | Status: DC
  Administered 2018-09-29 – 2018-10-05 (×7): 20 mg via ORAL
  Filled 2018-09-28 (×7): qty 1

## 2018-09-28 MED ORDER — SODIUM CHLORIDE 0.9 % IV SOLN
1.0000 g | Freq: Three times a day (TID) | INTRAVENOUS | Status: DC
  Administered 2018-09-28 – 2018-10-02 (×11): 1 g via INTRAVENOUS
  Filled 2018-09-28 (×14): qty 1

## 2018-09-28 MED ORDER — HYDROCHLOROTHIAZIDE 12.5 MG PO TABS: 12.5000 mg | ORAL_TABLET | Freq: Every day | ORAL | 3 refills | Status: AC

## 2018-09-28 MED ORDER — FUROSEMIDE 10 MG/ML IJ SOLN
40.0000 mg | Freq: Once | INTRAMUSCULAR | Status: AC
  Administered 2018-09-28: 40 mg via INTRAVENOUS
  Filled 2018-09-28: qty 4

## 2018-09-28 MED ORDER — ALBUTEROL SULFATE (2.5 MG/3ML) 0.083% IN NEBU
2.5000 mg | INHALATION_SOLUTION | Freq: Four times a day (QID) | RESPIRATORY_TRACT | Status: DC
  Administered 2018-09-28 – 2018-10-02 (×16): 2.5 mg via RESPIRATORY_TRACT
  Filled 2018-09-28 (×16): qty 3

## 2018-09-28 MED ORDER — LOSARTAN POTASSIUM 50 MG PO TABS: 50.0000 mg | ORAL_TABLET | Freq: Every day | ORAL | 3 refills | Status: DC

## 2018-09-28 MED ORDER — SODIUM CHLORIDE 0.9% FLUSH: 3.0000 mL | INTRAVENOUS | Status: DC | PRN

## 2018-09-28 MED ORDER — HEPARIN SODIUM (PORCINE) 5000 UNIT/ML IJ SOLN
5000.0000 [IU] | Freq: Three times a day (TID) | INTRAMUSCULAR | Status: DC
  Administered 2018-09-28 – 2018-10-05 (×16): 5000 [IU] via SUBCUTANEOUS
  Filled 2018-09-28 (×17): qty 1

## 2018-09-28 MED ORDER — PIPERACILLIN-TAZOBACTAM 3.375 G IVPB 30 MIN
3.3750 g | Freq: Once | INTRAVENOUS | Status: AC
  Administered 2018-09-28: 3.375 g via INTRAVENOUS
  Filled 2018-09-28: qty 50

## 2018-09-28 MED ORDER — VANCOMYCIN HCL IN DEXTROSE 1-5 GM/200ML-% IV SOLN
1000.0000 mg | Freq: Once | INTRAVENOUS | Status: AC
  Administered 2018-09-28: 1000 mg via INTRAVENOUS
  Filled 2018-09-28: qty 200

## 2018-09-28 MED ORDER — NITROGLYCERIN 2 % TD OINT
1.0000 [in_us] | TOPICAL_OINTMENT | Freq: Once | TRANSDERMAL | Status: AC
  Administered 2018-09-28: 1 [in_us] via TOPICAL
  Filled 2018-09-28: qty 1

## 2018-09-28 MED ORDER — LOSARTAN POTASSIUM 50 MG PO TABS
50.0000 mg | ORAL_TABLET | Freq: Every day | ORAL | Status: DC
  Administered 2018-09-29 – 2018-10-03 (×5): 50 mg via ORAL
  Filled 2018-09-28: qty 1
  Filled 2018-09-28: qty 2
  Filled 2018-09-28 (×2): qty 1
  Filled 2018-09-28: qty 2
  Filled 2018-09-28: qty 1

## 2018-09-28 NOTE — ED Notes (Signed)
EDP, Williams to bedside to give update to patient and friends via mobile interpreter.

## 2018-09-28 NOTE — ED Notes (Signed)
RT called for bipap

## 2018-09-28 NOTE — Telephone Encounter (Signed)
Received incoming fax from [facility]. Hyzaar on back order. Pharmacy is requesting separate scripts for losartan 50 mg and hydrochlorothiazide 12. 5 mg dosing.  I spoke with Dr. Rogue Bussing, who approved new scripts to be sent for these medications. V/o obtained

## 2018-09-28 NOTE — H&P (Signed)
[facility]: Eugene Burton    MR#:  [id number]  DATE OF BIRTH:  20-Oct-1960  DATE OF ADMISSION:  09/28/2018  PRIMARY CARE PHYSICIAN: Patient, No Pcp Per   REQUESTING/REFERRING PHYSICIAN: Dr. Jimmye Norman.  CHIEF COMPLAINT:   Chief Complaint  Patient presents with  . Shortness of Breath   Worsening shortness of breath today HISTORY OF PRESENT ILLNESS:  Eugene Burton  is a 58 y.o. male with a known history of recently diagnosed lung metastasis and pancreatic cancer stage IV, hypertension, GERD and deaf.  The patient was discharged recently after biopsy in the hospital.  He is put on oxygen by nasal cannula at home due to hypoxia.  He started to have a worsening shortness of breath this morning and was sent to the hospital today.  He is found in respiratory distress and put on BiPAP.  Chest x-ray show pulmonary edema or pneumonia or lung metastasis.  He is treated with Lasix and antibiotics in the ED.  He only complains of shortness of breath and a cough but denies any other symptoms.  He also mentioned bilateral leg edema for 1 week when I found bilateral edema 1+ during physical exam.  PAST MEDICAL HISTORY:   Past Medical History:  Diagnosis Date  . Deaf    patient needs interpreter   . GERD (gastroesophageal reflux disease)   . Hypertension     PAST SURGICAL HISTORY:   Past Surgical History:  Procedure Laterality Date  . broken leg    . OPEN REDUCTION INTERNAL FIXATION (ORIF) CALCANEAL FRACTURE WITH FUSION Right    right lower leg surgery s/p fracture    SOCIAL HISTORY:   Social History   Tobacco Use  . Smoking status: Current Some Day Smoker    Packs/day: 1.00    Types: Cigarettes  . Smokeless tobacco: Never Used  . Tobacco comment: materials given to patient as he is "slightly" interested in quittig  Substance Use Topics  . Alcohol use: No    FAMILY HISTORY:   Family History  Problem Relation Age of  Onset  . Cancer Sister   . Cancer Brother        skin cancer    DRUG ALLERGIES:  No Known Allergies  REVIEW OF SYSTEMS:   Review of Systems  Constitutional: Negative for chills, fever and malaise/fatigue.  HENT: Negative for sore throat.   Eyes: Negative for blurred vision and double vision.  Respiratory: Positive for cough and shortness of breath. Negative for hemoptysis, wheezing and stridor.   Cardiovascular: Negative for chest pain, palpitations, orthopnea and leg swelling.  Gastrointestinal: Negative for abdominal pain, blood in stool, diarrhea, melena, nausea and vomiting.  Genitourinary: Negative for dysuria, flank pain and hematuria.  Musculoskeletal: Negative for back pain and joint pain.  Skin: Negative for rash.  Neurological: Negative for dizziness, sensory change, focal weakness, seizures, loss of consciousness, weakness and headaches.  Endo/Heme/Allergies: Negative for polydipsia.  Psychiatric/Behavioral: Negative for depression. The patient is not nervous/anxious.     MEDICATIONS AT HOME:   Prior to Admission medications   Medication Sig Start Date End Date Taking? Authorizing Provider  albuterol (PROVENTIL HFA;VENTOLIN HFA) 108 (90 Base) MCG/ACT inhaler Inhale 2 puffs into the lungs every 6 (six) hours as needed for wheezing or shortness of breath. 09/22/18   Demetrios Loll, MD  guaiFENesin-codeine 100-10 MG/5ML syrup Take 10 mLs by mouth every 6 (six) hours as needed for cough. 09/27/18   Brahmanday,  Elisha Headland, MD  hydrochlorothiazide (HYDRODIURIL) 12.5 MG tablet Take 1 tablet (12.5 mg total) by mouth daily. 09/28/18   Cammie Sickle, MD  HYDROcodone-acetaminophen (NORCO/VICODIN) 5-325 MG tablet Take 1 tablet by mouth every 6 (six) hours as needed for moderate pain. 09/27/18   Cammie Sickle, MD  losartan (COZAAR) 50 MG tablet Take 1 tablet (50 mg total) by mouth daily. 09/28/18   Cammie Sickle, MD  losartan-hydrochlorothiazide (HYZAAR) 50-12.5 MG  tablet Take 1 tablet by mouth daily. 09/27/18   Cammie Sickle, MD  nebivolol (BYSTOLIC) 5 MG tablet Take 1 tablet (5 mg total) by mouth daily. 09/27/18   Cammie Sickle, MD  ranitidine (ZANTAC) 150 MG tablet Take 1 tablet (150 mg total) by mouth 2 (two) times daily. 09/27/18   Cammie Sickle, MD      VITAL SIGNS:  Blood pressure (!) 140/94, pulse (!) 119, temperature 98.3 F (36.8 C), temperature source Oral, resp. rate 20, height 5\' 3"  (1.6 m), weight 51.3 kg, SpO2 100 %.  PHYSICAL EXAMINATION:  Physical Exam Constitutional:      General: He is not in acute distress. HENT:     Head: Normocephalic.  Eyes:     General: No scleral icterus.    Conjunctiva/sclera: Conjunctivae normal.     Pupils: Pupils are equal, round, and reactive to light.  Neck:     Musculoskeletal: Normal range of motion and neck supple.     Vascular: No JVD.     Trachea: No tracheal deviation.  Cardiovascular:     Rate and Rhythm: Normal rate and regular rhythm.     Heart sounds: Normal heart sounds. No murmur. No gallop.   Pulmonary:     Effort: Pulmonary effort is normal. No respiratory distress.     Breath sounds: Rales present. No wheezing.  Abdominal:     General: Bowel sounds are normal. There is no distension.     Palpations: Abdomen is soft.     Tenderness: There is no abdominal tenderness. There is no rebound.  Musculoskeletal: Normal range of motion.        General: No tenderness.     Right lower leg: Edema present.     Left lower leg: Edema present.  Skin:    Findings: No erythema or rash.  Neurological:     General: No focal deficit present.     Mental Status: He is alert and oriented to person, place, and time.     Cranial Nerves: No cranial nerve deficit.  Psychiatric:        Mood and Affect: Mood normal.   .   LABORATORY PANEL:   CBC Recent Labs  Lab 09/28/18 1251  WBC 13.4*  HGB 9.0*  HCT 26.8*  PLT 214    ------------------------------------------------------------------------------------------------------------------  Chemistries  Recent Labs  Lab 09/22/18 0526 09/28/18 1251  NA 130* 129*  K 3.5 3.3*  CL 98 91*  CO2 24 29  GLUCOSE 133* 132*  BUN 10 10  CREATININE 0.69 0.53*  CALCIUM 8.1* 8.6*  MG 1.8  --    ------------------------------------------------------------------------------------------------------------------  Cardiac Enzymes Recent Labs  Lab 09/28/18 1251  TROPONINI <0.03   ------------------------------------------------------------------------------------------------------------------  RADIOLOGY:  Dg Chest Port 1 View  Result Date: 09/28/2018 CLINICAL DATA:  Dyspnea.  History of COPD and lung carcinoma. EXAM: PORTABLE CHEST 1 VIEW COMPARISON:  CT chest, abdomen and pelvis and PA and lateral chest 09/18/2018. FINDINGS: Extensive bilateral interstitial and alveolar opacities or predominantly perihilar and appear somewhat worse  than on the comparison examination. Heart size is normal. No pneumothorax or pleural effusion. No acute bony abnormality. IMPRESSION: Extensive bilateral perihilar alveolar opacities could be due to pulmonary edema, multifocal pneumonia or possibly lymphangitic spread of tumor. Electronically Signed   By: Inge Rise M.D.   On: 09/28/2018 13:19      IMPRESSION AND PLAN:   Acute on chronic respiratory failure with hypoxia due to pulmonary edema, pneumonia or lung metastasis The patient will be admitted to stepdown unit. Continue BiPAP, albuterol every 6 hours, Robitussin as needed. Intensivist consult.  Pulmonary edema, possible due to acute CHF. CHF protocol, Lasix every 12 hours, echocardiograph.  Possible pneumonia with leukocytosis.   Start cefepime and vancomycin due to recent hospitalization.  Pancreatic cancer with lung metastasis stage IV. Palliative care consult.  Hyponatremia.  Chronic.  Hypokalemia, potassium  supplement. Discussed with intensivist NP Hinton Dyer. O interview and physical examination is with help of ALS interpreter. All the records are reviewed and case discussed with ED provider. Management plans discussed with the patient, family and they are in agreement.  CODE STATUS: Full code  TOTAL TIME TAKING CARE OF THIS PATIENT:  35 minutes.    Demetrios Loll M.D on 09/28/2018 at 2:33 PM  Between 7am to 6pm - Pager - [phone]  After 6pm go to [email or web address] - Proofreader  [facility]  Office  [phone]  CC: Primary care physician; Patient, No Pcp Per   Note: This dictation was prepared with Dragon dictation along with smaller phrase technology. Any transcriptional errors that result from this process are unin

## 2018-09-28 NOTE — Progress Notes (Signed)
Tumor Board Documentation  Eugene Burton was presented by Dr Rogue Bussing at our Tumor Board on 09/28/2018, which included representatives from medical oncology, surgical oncology, surgical, radiology, pathology, navigation, internal medicine, nutrition, research, pulmonology.  Eugene Burton currently presents as a new patient, for new positive pathology with history of the following treatments: surgical intervention(s).  Additionally, we reviewed previous medical and familial history, history of present illness, and recent lab results along with all available histopathologic and imaging studies. The tumor board considered available treatment options and made the following recommendations:   Send path for MMR, Awaiting APS for determination of pt ability to make decisions for self, FU Med Onc 1/29  The following procedures/referrals were also placed: No orders of the defined types were placed in this encounter.   Clinical Trial Status: not discussed   Staging used: AJCC Stage Group  National site-specific guidelines NCCN were discussed with respect to the case.  Tumor board is a meeting of clinicians from various specialty areas who evaluate and discuss patients for whom a multidisciplinary approach is being considered. Final determinations in the plan of care are those of the provider(s). The responsibility for follow up of recommendations given during tumor board is that of the provider.   Today's extended care, comprehensive team conference, Eugene Burton was not present for the discussion and was not examined.   Multidisciplinary Tumor Board is a multidisciplinary case peer review process.  Decisions discussed in the Multidisciplinary Tumor Board reflect the opinions of the specialists present at the conference without having examined the patient.  Ultimately, treatment and diagnostic decisions rest with the primary provider(s) and the patient.

## 2018-09-28 NOTE — ED Notes (Signed)
Hospitalist to bedside at this time; mobile interpreter set up.

## 2018-09-28 NOTE — Progress Notes (Signed)
Advanced Care Plan.  Purpose of Encounter: CODE STATUS and hospice care. Parties in Attendance: The patient, and me. Patient's Decisional Capacity: Yes. Medical Story: Eugene Burton  is a 58 y.o. male with a known history of recently diagnosed lung metastasis and pancreatic cancer stage IV, hypertension, GERD and deaf.   The patient is being admitted to stepdown unit due to acute on chronic respiratory failure due to pulmonary edema or pneumonia or lung metastasis.  I discussed with the patient about his current critical condition, very poor prognosis, CODE STATUS and hospice care.  He said he want to be resuscitated and intubated if he has cardiopulmonary arrest.  But he agreed to get hospice care. Goals of Care Determinations: Hospice care. Plan:  Code Status: Full code. Other documents completed: Request palliative care consult to discuss hospice care. Time spent discussing advance care planning: 22 minutes.

## 2018-09-28 NOTE — Telephone Encounter (Signed)
Duplicate Phone encounter opened in error

## 2018-09-28 NOTE — Consult Note (Signed)
Pharmacy Antibiotic Note  Eugene Burton is a 58 y.o. male admitted on 09/28/2018 with shortness of breath and suspected pneumonia.   Pharmacy has been consulted for vancomycin dosing. Patient received vancomycin 1g IV x 1. Patient is also receiving Cefepime.    Plan: Start Vancomycin 1250 mg IV Q 24 hrs. Goal AUC 400-550. Expected AUC: 509 SCr used: 0.8   Height: 5\' 3"  (160 cm) Weight: 113 lb (51.3 kg) IBW/kg (Calculated) : 56.9  Temp (24hrs), Avg:98.3 F (36.8 C), Min:98.3 F (36.8 C), Max:98.3 F (36.8 C)  Recent Labs  Lab 09/22/18 0526 09/28/18 1251  WBC 5.4 13.4*  CREATININE 0.69 0.53*    Estimated Creatinine Clearance: 74.8 mL/min (A) (by C-G formula based on SCr of 0.53 mg/dL (L)).    No Known Allergies  Antimicrobials this admission: 1/16 Zosyn x 1  1/16 Cefepime >> 1/16 vancomycin >>   Dose adjustments this admission:   Microbiology results: 1/16 MRSA PCR: pending  Thank you for allowing pharmacy to be a part of this patient's care.  Pernell Dupre, PharmD, BCPS Clinical Pharmacist 09/28/2018 3:08 PM

## 2018-09-28 NOTE — ED Notes (Signed)
Nitro drip stopped at this time per EDP, Jimmye Norman verbal order.

## 2018-09-28 NOTE — ED Provider Notes (Addendum)
[facility] Emergency Department Provider Note       Time seen: ----------------------------------------- 1:13 PM on 09/28/2018 -----------------------------------------  Level V caveat: History/ROS limited by respiratory distress I have reviewed the triage vital signs and the nursing notes.  HISTORY   Chief Complaint Shortness of Breath    HPI Eugene Burton is a 58 y.o. male with a history of GERD, hypertension who presents to the ED for acute respiratory distress.  Patient arrives with oxygen saturation of 83%.  He is on chronically 2 L of nasal cannula oxygen.  He is deaf at baseline, received DuoNeb and albuterol treatment in route.  He was noted to be markedly hypertensive and markedly short of breath on arrival.  Past Medical History:  Diagnosis Date  . Deaf    patient needs interpreter   . GERD (gastroesophageal reflux disease)   . Hypertension     Patient Active Problem List   Diagnosis Date Noted  . 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 Malnutrition of moderate degree 09/21/2018  . Metastatic cancer ([place]) 09/19/2018  . GERD (gastroesophageal reflux disease) 03/23/2016  . HTN (hypertension) 03/23/2016    Past Surgical History:  Procedure Laterality Date  . broken leg    . OPEN REDUCTION INTERNAL FIXATION (ORIF) CALCANEAL FRACTURE WITH FUSION Right    right lower leg surgery s/p fracture    Allergies Patient has no known allergies.  Social History Social History   Tobacco Use  . Smoking status: Current Some Day Smoker    Packs/day: 1.00    Types: Cigarettes  . Smokeless tobacco: Never Used  . Tobacco comment: materials given to patient as he is "slightly" interested in quittig  Substance Use Topics  . Alcohol use: No  . Drug use: No   Review of Systems Positive for shortness of breath, otherwise unknown due to respiratory distress  All systems negative/normal/unremarkable except as stated in the  HPI  ____________________________________________   PHYSICAL EXAM:  VITAL SIGNS: ED Triage Vitals  Enc Vitals Group     BP 09/28/18 1254 (!) 172/115     Pulse Rate 09/28/18 1254 (!) 125     Resp 09/28/18 1254 (!) 28     Temp 09/28/18 1254 98.3 F (36.8 C)     Temp Source 09/28/18 1254 Oral     SpO2 09/28/18 1254 (!) 83 %     Weight 09/28/18 1257 113 lb (51.3 kg)     Height 09/28/18 1257 5\' 3"  (1.6 m)     Head Circumference --      Peak Flow --      Pain Score --      Pain Loc --      Pain Edu? --      Excl. in [place]? --    Constitutional: Alert and oriented.  Moderate distress Eyes: Conjunctivae are normal. Normal extraocular movements. ENT      Head: Normocephalic and atraumatic.      Nose: No congestion/rhinnorhea.      Mouth/Throat: Mucous membranes are moist.      Neck: No stridor. Cardiovascular: Normal rate, regular rhythm. No murmurs, rubs, or gallops. Respiratory: Tachypnea with rales diffusely bilaterally Gastrointestinal: Soft and nontender. Normal bowel sounds Musculoskeletal: Nontender with normal range of motion in extremities.  Bilateral pitting edema is noted Neurologic:   No gross focal neurologic deficits are appreciated.  Skin: Diaphoresis is noted Psychiatric: Anxious ____________________________________________  EKG: Interpreted by me.  Sinus tachycardia rate 125 bpm, left anterior fascicular  block, abnormal R wave progression  EKG: repeat interpreted by me, sinus tachycardia with a rate of left anterior fascicular block, LVH, long QT ____________________________________________  ED COURSE:  As part of my medical decision making, I reviewed the following data within the [place] History obtained from family if available, nursing notes, old chart and ekg, as well as notes from prior ED visits. Patient presented for acute respiratory distress and failure, we will assess with labs and imaging as indicated at this time.    Procedures ____________________________________________   LABS (pertinent positives/negatives)  Labs Reviewed  CBC WITH DIFFERENTIAL/PLATELET - Abnormal; Notable for the following components:      Result Value   WBC 13.4 (*)    RBC 3.26 (*)    Hemoglobin 9.0 (*)    HCT 26.8 (*)    RDW 17.9 (*)    Neutro Abs 10.9 (*)    Monocytes Absolute 1.2 (*)    Abs Immature Granulocytes 0.12 (*)    All other components within normal limits  BASIC METABOLIC PANEL - Abnormal; Notable for the following components:   Sodium 129 (*)    Potassium 3.3 (*)    Chloride 91 (*)    Glucose, Bld 132 (*)    Creatinine, Ser 0.53 (*)    Calcium 8.6 (*)    All other components within normal limits  BRAIN NATRIURETIC PEPTIDE - Abnormal; Notable for the following components:   B Natriuretic Peptide 307.0 (*)    All other components within normal limits  BLOOD GAS, VENOUS - Abnormal; Notable for the following components:   pO2, Ven 49.0 (*)    Bicarbonate 31.4 (*)    Acid-Base Excess 4.9 (*)    All other components within normal limits  TROPONIN I   CRITICAL CARE Performed by: Laurence Aly   Total critical care time: 30 minutes  Critical care time was exclusive of separately billable procedures and treating other patients.  Critical care was necessary to treat or prevent imminent or life-threatening deterioration.  Critical care was time spent personally by me on the following activities: development of treatment plan with patient and/or surrogate as well as nursing, discussions with consultants, evaluation of patient's response to treatment, examination of patient, obtaining history from patient or surrogate, ordering and performing treatments and interventions, ordering and review of laboratory studies, ordering and review of radiographic studies, pulse oximetry and re-evaluation of patient's condition.  RADIOLOGY Images were viewed by me  Chest x-ray IMPRESSION: Extensive bilateral  perihilar alveolar opacities could be due to pulmonary edema, multifocal pneumonia or possibly lymphangitic spread of tumor. ____________________________________________   DIFFERENTIAL DIAGNOSIS   Acute respiratory failure, CHF, COPD, pneumonia, PE, pneumothorax, metastatic cancer  FINAL ASSESSMENT AND PLAN  Acute respiratory failure   Plan: The patient had presented for acute respiratory distress and failure. Patient's labs did reveal leukocytosis, elevated BNP and some hyponatremia. Patient's imaging revealed extensive bilateral alveolar opacities.  I think this is most likely edema.  We ordered IV Lasix and gave him nitroglycerin.  I will also write for broad-spectrum antibiotics.  He is currently on BiPAP and looks improved.  This also could be cancer progression in which case he likely needs to be palliative care.  I will discuss with the hospitalist for admission.   Laurence Aly, MD    Note: This note was generated in part or whole with voice recognition software. Voice recognition is usually quite accurate but there are transcription errors that can and very  often do occur. I apologize for any typographical errors that were not detected and corrected.     Earleen Newport, MD 09/28/18 1405    Earleen Newport, MD 09/28/18 1447

## 2018-09-28 NOTE — ED Notes (Signed)
Attempted to call report, ICU declines patient at this time.  Agricultural consultant notified.

## 2018-09-28 NOTE — Care Management Note (Signed)
Case Management Note  Patient Details  Name: COLONEL KRAUSER MRN: [id number] Date of Birth: [birth year]  Subjective/Objective:   Patient is being admitted for shortness of breath and has diagnosed lung metastasis and pancreatic cancer stage IV.  Patient was placed on Bipap in the ED and will be admitted to the ICU.  Patient is on chronic O2 2L at home.  Patient is deaf and requires interpreter.  Patient is being followed very closely by [place] [facility], Sammuel Bailiff is the SW for DSS (office: [phone], cell: [phone]).  RNCM was able to contact [place] and let her know about patient's hospitalization.  Caryl Comes reports that they are in the process of getting patient placed at [facility]- they believe that this is the best option for him- he needs to be assessed by his counselor from [facility], name Nicki Reaper, to assess competence.  Patient is under extreme stress from his cancer diagnosis to his home being foreclosed and his wife dying this past August.  RNCM will place SW consult for them to be involved and assist with placement at discharge.  Palliative is involved in care team, Altha Harm has been following patient.  RNCM will cont to follow. Doran Clay RN BSN [phone]                     Action/Plan:   Expected Discharge Date:                  Expected Discharge Plan:     In-House Referral:     Discharge planning Services     Post Acute Care Choice:    Choice offered to:     DME Arranged:    DME Agency:     HH Arranged:    HH Agency:     Status of Service:     If discussed at [place] of Stay Meetings, dates discussed:    Additional Comments:  Shelbie Hutching, RN 09/28/2018, 3:35 PM

## 2018-09-28 NOTE — Consult Note (Signed)
Name: Eugene Burton MRN: [id number] DOB: Feb 15, 1961     CONSULTATION DATE: 09/28/2018  REFERRING MD :  Demetrios Loll, MD  CHIEF COMPLAINT:  Shortness of Breath  HISTORY OF PRESENT ILLNESS:  Patient is a 58 y.o. 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 Pt reports having worsening shortness of breath with home O2 via nasal canula this morning and was brought to the ED. He was placed on BiPAP. Chest X-ray shows extensive bilateral perihilar alveolar opacities, possible pulmonary edema vs. pneumonia vs. lung masses. Treated with Lasix, Vanco, Zosyn in the ED. Patient is subsequently transported to ICU for acute respiratory distress. Pt's caretaker at bedside and reports pt has decreased appetite in the past 2 weeks. Pt reports fall and hit his head this morning without lost of consciousness. Reports abdominal pain, back pain, and lower extremities edema.  Reports breathing has improved with BiPAP. Communication established via caretaker and sign language interpreter.    SIGNIFICANT EVENTS/STUDIES:  1/16 - Arrived at ED w/ worsening SOB, placed on BiPAP, transferred to ICU   PAST MEDICAL HISTORY :   has a past medical history of Deaf, GERD (gastroesophageal reflux disease), and Hypertension.  has a past surgical history that includes broken leg and Open reduction, internal fixation (orif) calcaneal fracture with fusion (Right). Prior to Admission medications   Medication Sig Start Date End Date Taking? Authorizing Provider  albuterol (PROVENTIL HFA;VENTOLIN HFA) 108 (90 Base) MCG/ACT inhaler Inhale 2 puffs into the lungs every 6 (six) hours as needed for wheezing or shortness of breath. 09/22/18  Yes Demetrios Loll, MD  guaiFENesin-codeine 100-10 MG/5ML syrup Take 10 mLs by mouth every 6 (six) hours as needed for cough. 09/27/18  Yes  Cammie Sickle, MD  HYDROcodone-acetaminophen (NORCO/VICODIN) 5-325 MG tablet Take 1 tablet by mouth every 6 (six) hours as needed for moderate pain. 09/27/18  Yes Cammie Sickle, MD  losartan-hydrochlorothiazide (HYZAAR) 50-12.5 MG tablet Take 1 tablet by mouth daily. 09/27/18  Yes Cammie Sickle, MD  nebivolol (BYSTOLIC) 5 MG tablet Take 1 tablet (5 mg total) by mouth daily. 09/27/18  Yes Cammie Sickle, MD  ranitidine (ZANTAC) 150 MG tablet Take 1 tablet (150 mg total) by mouth 2 (two) times daily. 09/27/18  Yes Cammie Sickle, MD  hydrochlorothiazide (HYDRODIURIL) 12.5 MG tablet Take 1 tablet (12.5 mg total) by mouth daily. 09/28/18   Cammie Sickle, MD  losartan (COZAAR) 50 MG tablet Take 1 tablet (50 mg total) by mouth daily. 09/28/18   Cammie Sickle, MD   No Known Allergies  FAMILY HISTORY:  family history includes Cancer in his brother and sister. SOCIAL HISTORY:  reports that he has been smoking cigarettes. He has been smoking about 1.00 pack per day. He has never used smokeless tobacco. He reports that he does not drink alcohol or use drugs.  REVIEW OF SYSTEMS:   Review of Systems  Respiratory: Positive for shortness of breath. Negative for cough and wheezing.   Cardiovascular: Positive for leg swelling. Negative for chest pain and palpitations.  Gastrointestinal: Positive for abdominal pain, diarrhea and vomiting (Decreased appetite, spit out food after eating).  Musculoskeletal: Positive for back pain and falls (This morning, hit his head, no LOC, a bit dizziness ).  Neurological: Positive for dizziness. Negative for loss of consciousness.  All other systems reviewed and are negative.  VITAL SIGNS: Temp:  [98.3 F (36.8 C)] 98.3 F (36.8 C) (01/16 1254) Pulse Rate:  [104-129] 104 (01/16 1600) Resp:  [19-37] 20 (01/16 1600) BP: (125-172)/(82-115) 150/97 (01/16 1600) SpO2:  [83 %-100 %] 100 % (01/16 1600) Weight:  [51.3 kg] 51.3  kg (01/16 1257)  Physical Examination:  GENERAL: Ill appearing, +resp distress HEAD: Normocephalic, atraumatic.  EYES: Pupils equal, round, reactive to light.  No scleral icterus.  MOUTH: Moist mucosal membrane. NECK: Supple. No JVD.  PULMONARY: Clear lung sound  CARDIOVASCULAR: S1 and S2. Regular rate and rhythm. No murmurs, rubs, or gallops.  GASTROINTESTINAL: Soft, nontender, -distended. No masses. Positive bowel sounds. No hepatosplenomegaly.  MUSCULOSKELETAL: Bilateral LE edema.  NEUROLOGIC: Alert, oriented, awake. Able to communicate via sign language  SKIN: Intact,warm,dry  I personally reviewed lab work that was obtained in last 24 hrs. CXR Independently reviewed- Chest X-ray shows extensive bilateral perihilar alveolar opacities   ASSESSMENT / PLAN:  Pt is a 58 y.o. male presents to ICU on BiPAP with acute respiratory failure on multiple lung masses with COPD  Acute Respiratory Failure possible due to widely CA metastasis vs. COPD vs. Pulmonary Edema vs. Pneumonia vs, CHF Continue on BiPAP Give one dose of Solu-Medrol to maintain airway Currently on Lasix Currently on cefepime and Vancomycin. Schedule bronchodilator therapy  ECHO pending  Metastasized Cancer Consult oncology  Consult palliative care  Anemia w/o acute blood loss Trend CBC Monitor blood loss Transfuse if Hgb < 7  Pain management Morphine PRN  VTE Prophylaxis SubQ Haparin   Overall, patient is critically ill, prognosis is guarded.     Jo-ku Mervyn Skeeters, PA-Student

## 2018-09-28 NOTE — ED Triage Notes (Signed)
Pt arrives via ems from home with concerns over shortness of breath. Pt arrives with oxygen saturation of 83% on pt's chronic 2L of oxygen. Pt deaf at baseline. Pt given a duo neb and 1 albuterol treatment in route. Pt hypertensive in triage.

## 2018-09-29 ENCOUNTER — Inpatient Hospital Stay: Payer: Medicare HMO

## 2018-09-29 ENCOUNTER — Encounter: Payer: Self-pay | Admitting: *Deleted

## 2018-09-29 ENCOUNTER — Inpatient Hospital Stay
Admit: 2018-09-29 | Discharge: 2018-09-29 | Disposition: A | Discharge status: Home / Self Care | Payer: Medicare HMO | Attending: Internal Medicine | Admitting: Internal Medicine

## 2018-09-29 DIAGNOSIS — J9601 Acute respiratory failure with hypoxia: Secondary | ICD-10-CM

## 2018-09-29 DIAGNOSIS — Z66 Do not resuscitate: Secondary | ICD-10-CM

## 2018-09-29 DIAGNOSIS — E43 Unspecified severe protein-calorie malnutrition: Secondary | ICD-10-CM

## 2018-09-29 DIAGNOSIS — C184 Malignant neoplasm of transverse colon: Secondary | ICD-10-CM

## 2018-09-29 DIAGNOSIS — F17210 Nicotine dependence, cigarettes, uncomplicated: Secondary | ICD-10-CM

## 2018-09-29 DIAGNOSIS — G893 Neoplasm related pain (acute) (chronic): Secondary | ICD-10-CM

## 2018-09-29 DIAGNOSIS — J9621 Acute and chronic respiratory failure with hypoxia: Secondary | ICD-10-CM

## 2018-09-29 DIAGNOSIS — J449 Chronic obstructive pulmonary disease, unspecified: Secondary | ICD-10-CM

## 2018-09-29 DIAGNOSIS — Z515 Encounter for palliative care: Secondary | ICD-10-CM

## 2018-09-29 DIAGNOSIS — R0602 Shortness of breath: Secondary | ICD-10-CM

## 2018-09-29 DIAGNOSIS — J9600 Acute respiratory failure, unspecified whether with hypoxia or hypercapnia: Secondary | ICD-10-CM

## 2018-09-29 LAB — CBC
HCT: 24.7 % — ABNORMAL LOW (ref 39.0–52.0)
Hemoglobin: 7.9 g/dL — ABNORMAL LOW (ref 13.0–17.0)
MCH: 27 pg (ref 26.0–34.0)
MCHC: 32 g/dL (ref 30.0–36.0)
MCV: 84.3 fL (ref 80.0–100.0)
Platelets: 137 10*3/uL — ABNORMAL LOW (ref 150–400)
RBC: 2.93 MIL/uL — ABNORMAL LOW (ref 4.22–5.81)
RDW: 18 % — ABNORMAL HIGH (ref 11.5–15.5)
WBC: 4 10*3/uL (ref 4.0–10.5)
nRBC: 0 % (ref 0.0–0.2)

## 2018-09-29 LAB — BASIC METABOLIC PANEL
Anion gap: 10 (ref 5–15)
BUN: 14 mg/dL (ref 6–20)
CO2: 35 mmol/L — ABNORMAL HIGH (ref 22–32)
Calcium: 8.3 mg/dL — ABNORMAL LOW (ref 8.9–10.3)
Chloride: 87 mmol/L — ABNORMAL LOW (ref 98–111)
Creatinine, Ser: 0.84 mg/dL (ref 0.61–1.24)
GFR calc Af Amer: >60 mL/min (ref >60)
GFR calc non Af Amer: >60 mL/min (ref >60)
Glucose, Bld: 195 mg/dL — ABNORMAL HIGH (ref 70–99)
POTASSIUM: 3.1 mmol/L — AB (ref 3.5–5.1)
Sodium: 132 mmol/L — ABNORMAL LOW (ref 135–145)

## 2018-09-29 LAB — POTASSIUM: POTASSIUM: 3.5 mmol/L (ref 3.5–5.1)

## 2018-09-29 LAB — ECHOCARDIOGRAM COMPLETE
Height: 62.992 in
WEIGHTICAEL: 1728.41 [oz_av]

## 2018-09-29 LAB — GLUCOSE, CAPILLARY: Glucose-Capillary: 175 mg/dL — ABNORMAL HIGH (ref 70–99)

## 2018-09-29 MED ORDER — MAGNESIUM SULFATE 2 GM/50ML IV SOLN
2.0000 g | Freq: Once | INTRAVENOUS | Status: AC
  Administered 2018-09-29: 2 g via INTRAVENOUS
  Filled 2018-09-29: qty 50

## 2018-09-29 MED ORDER — POTASSIUM CHLORIDE 10 MEQ/100ML IV SOLN
10.0000 meq | INTRAVENOUS | Status: AC
  Administered 2018-09-29 (×4): 10 meq via INTRAVENOUS
  Filled 2018-09-29 (×4): qty 100

## 2018-09-29 MED ORDER — ENSURE ENLIVE PO LIQD
237.0000 mL | Freq: Three times a day (TID) | ORAL | Status: DC
  Administered 2018-09-30 – 2018-10-05 (×6): 237 mL via ORAL

## 2018-09-29 MED ORDER — GUAIFENESIN-DM 100-10 MG/5ML PO SYRP
5.0000 mL | ORAL_SOLUTION | ORAL | Status: DC | PRN
  Administered 2018-09-29 – 2018-10-01 (×5): 5 mL via ORAL
  Filled 2018-09-29 (×6): qty 5

## 2018-09-29 MED ORDER — ADULT MULTIVITAMIN W/MINERALS CH
1.0000 | ORAL_TABLET | Freq: Every day | ORAL | Status: DC
  Administered 2018-09-30 – 2018-10-05 (×4): 1 via ORAL
  Filled 2018-09-29 (×6): qty 1

## 2018-09-29 NOTE — Progress Notes (Signed)
[place] [place]: Eugene Burton    MR#:  [id number]  DATE OF BIRTH:  1961/03/08  SUBJECTIVE:  CHIEF COMPLAINT:   Chief Complaint  Patient presents with  . Shortness of Breath  Patient seen and evaluated today And information obtained with the help of interpreter Patient is deaf and dumb Has shortness of breath and cough   REVIEW OF SYSTEMS:    ROS  CONSTITUTIONAL: No documented fever. No fatigue, weakness. No weight gain, no weight loss.  EYES: No blurry or double vision.  ENT: No tinnitus. No postnasal drip. No redness of the oropharynx.  RESPIRATORY: Has cough, no wheeze, no hemoptysis. Has dyspnea.  CARDIOVASCULAR: No chest pain. No orthopnea. No palpitations. No syncope.  GASTROINTESTINAL: No nausea, no vomiting or diarrhea. No abdominal pain. No melena or hematochezia.  GENITOURINARY: No dysuria or hematuria.  ENDOCRINE: No polyuria or nocturia. No heat or cold intolerance.  HEMATOLOGY: No anemia. No bruising. No bleeding.  INTEGUMENTARY: No rashes. No lesions.  MUSCULOSKELETAL: No arthritis. No swelling. No gout.  NEUROLOGIC: No numbness, tingling, or ataxia. No seizure-type activity.  PSYCHIATRIC: No anxiety. No insomnia. No ADD.   DRUG ALLERGIES:  No Known Allergies  VITALS:  Blood pressure 132/88, pulse (!) 106, temperature 97.9 F (36.6 C), temperature source Axillary, resp. rate 20, height 5' 2.99" (1.6 m), weight 49 kg, SpO2 98 %.  PHYSICAL EXAMINATION:   Physical Exam  GENERAL:  58 y.o.-year-old patient lying in the bed with no acute distress.  EYES: Pupils equal, round, reactive to light and accommodation. No scleral icterus. Extraocular muscles intact.  HEENT: Head atraumatic, normocephalic. Oropharynx and nasopharynx clear.  NECK:  Supple, no jugular venous distention. No thyroid enlargement, no tenderness.  LUNGS: Decreased breath sounds bilaterally, rales heard in both lungs. No use of accessory  muscles of respiration.  CARDIOVASCULAR: S1, S2 tachycardia noted. No murmurs, rubs, or gallops.  ABDOMEN: Soft, nontender, nondistended. Bowel sounds present. No organomegaly or mass.  EXTREMITIES: No cyanosis, clubbing or edema b/l.    NEUROLOGIC: Cranial nerves II through XII are intact. No focal Motor or sensory deficits b/l.   PSYCHIATRIC: The patient is alert and oriented x 3.  SKIN: No obvious rash, lesion, or ulcer.   LABORATORY PANEL:   CBC Recent Labs  Lab 09/29/18 0359  WBC 4.0  HGB 7.9*  HCT 24.7*  PLT 137*   ------------------------------------------------------------------------------------------------------------------ Chemistries  Recent Labs  Lab 09/28/18 1843 09/29/18 0359  NA  --  132*  K  --  3.1*  CL  --  87*  CO2  --  35*  GLUCOSE  --  195*  BUN  --  14  CREATININE  --  0.84  CALCIUM  --  8.3*  MG 1.7  --    ------------------------------------------------------------------------------------------------------------------  Cardiac Enzymes Recent Labs  Lab 09/28/18 1251  TROPONINI <0.03   ------------------------------------------------------------------------------------------------------------------  RADIOLOGY:  Dg Chest Port 1 View  Result Date: 09/29/2018 CLINICAL DATA:  Acute respiratory failure EXAM: PORTABLE CHEST 1 VIEW COMPARISON:  Chest radiograph dated 09/28/2018. CT chest dated 09/18/2018. FINDINGS: Increased interstitial markings in the lungs bilaterally. When correlating with recent CT, this appearance favors metastatic disease, although superimposed infection or interstitial edema is not excluded. No definite pleural effusions. No pneumothorax. The heart is normal in size. IMPRESSION: Increased interstitial markings in the lungs bilaterally, favoring metastatic disease when correlating with recent CT. Electronically Signed   By: Julian Hy M.D.   On: 09/29/2018  03:39   Dg Chest Port 1 View  Result Date: 09/28/2018 CLINICAL  DATA:  Dyspnea.  History of COPD and lung carcinoma. EXAM: PORTABLE CHEST 1 VIEW COMPARISON:  CT chest, abdomen and pelvis and PA and lateral chest 09/18/2018. FINDINGS: Extensive bilateral interstitial and alveolar opacities or predominantly perihilar and appear somewhat worse than on the comparison examination. Heart size is normal. No pneumothorax or pleural effusion. No acute bony abnormality. IMPRESSION: Extensive bilateral perihilar alveolar opacities could be due to pulmonary edema, multifocal pneumonia or possibly lymphangitic spread of tumor. Electronically Signed   By: Inge Rise M.D.   On: 09/28/2018 13:19     ASSESSMENT AND PLAN:   58 year old male patient with history of stage IV pancreatic cancer, recently diagnosed metastatic lung cancer, deafness, numbness, hypertension currently under stepdown unit for shortness of breath  -Acute on chronic respiratory failure with hypoxia Secondary to metastatic cancer and pneumonia Weaned off BiPAP Nebulization treatments Antitussive medication Oncology follow-up Patient had lengthy discussion with palliative care team  -Acute pulmonary edema Diurese with Lasix Monitor electrolytes  -Pneumonia Continue broad-spectrum IV vancomycin and cefepime antibiotics  -Metastatic cancer Lengthy discussion with palliative care team Patient wants to be DNR and wants hospice services social worker follow-up  All the records are reviewed and case discussed with Care Management/Social Worker. Management plans discussed with the patient, family and they are in agreement.  CODE STATUS: DNR  DVT Prophylaxis: SCDs  TOTAL TIME TAKING CARE OF THIS PATIENT: 35 minutes.   POSSIBLE D/C IN 2 to 3 DAYS, DEPENDING ON CLINICAL CONDITION.  Saundra Shelling M.D on 09/29/2018 at 1:16 PM  Between 7am to 6pm - Pager - [phone]  After 6pm go to [email or web address] - password [email or web address]  Office  [phone]  CC: Primary  care physician; Patient, No Pcp Per  Note: This dictation was prepared with Dragon dictation along with smaller phrase technology. Any transcriptional errors that result from this process are unintentional.

## 2018-09-29 NOTE — Progress Notes (Signed)
*  PRELIMINARY RESULTS* Echocardiogram 2D Echocardiogram has been performed.  Eugene Burton 09/29/2018, 2:25 PM

## 2018-09-29 NOTE — Consult Note (Signed)
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   #Acute respiratory failure-COPD/fluid overload/pneumonia-currently on BiPAP  #Malignancy related pain-continue Vicodin for pain  # hearing/ speech impaired/ active smoker  Recommendations:   #Long discussion with patient-that given his multiple malignancies/poor performance status/acute respiratory issues-patient significantly high risk of adverse events from chemotherapy  which okay benefits of chemotherapy.  Recommend hospice.  #Discussed genetic testing-patient in agreement.   #DNR/DNI-discussed; patient is ambivalent.  No decisions made.  Await palliative care evaluation.  Thank you Dr.Pyreddy for allowing me to participate in the care of your pleasant patient. Please do not hesitate to contact me with questions or concerns in the interim.  Discussed with Dr. Estanislado Pandy.  # 60 minutes face-to-face with the patient discussing the above plan of care; more than 50% of time spent on prognosis/ natural history; counseling and coordination.  Addendum: After extensive discussion with palliative care-patient agreed with hospice/DNR/DNI.  Likely be discharged to skilled facility with hospice services.  Discussed with Josh Borders; appreciate  recommendations.  All questions were answered. The patient knows to call the clinic with any problems, questions or concerns.    Cammie Sickle, MD 09/29/2018 7:09 PM

## 2018-09-29 NOTE — Progress Notes (Signed)
   09/29/18 1300  Clinical Encounter Type  Visited With Patient  Visit Type Initial  Referral From Nurse  Pt wants to fill out an advance directive. Ch provided education. Pt will reach out when he has filled out the form around 4-5PM today.

## 2018-09-29 NOTE — Progress Notes (Signed)
Initial Nutrition Assessment  DOCUMENTATION CODES:   Severe malnutrition in context of chronic illness  INTERVENTION:   Ensure Enlive po TID, each supplement provides 350 kcal and 20 grams of protein  Magic cup TID with meals, each supplement provides 290 kcal and 9 grams of protein  MVI daily   Pt at moderate to high refeed risk; recommend monitor K, Mg and P labs daily until stable   NUTRITION DIAGNOSIS:   Severe Malnutrition related to cancer and cancer related treatments as evidenced by 18 percent weight loss in 4 months, moderate fat depletion, moderate to severe muscle depletions.  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    Pt s/p peritoneal mass biopsy on 1/9 found to have metastatic adenocarcinoma   Pt familiar to nutrition department from recent previous admit. Pt is a poor historian; requires communication via sign interpreter. Pt with poor appetite and oral intake; eating only sips and bites of meals. Pt eating <50% of meals during his last admit ~1 week ago. Per chart, pt weighed 137lbs in September, 134lbs in October, 127lbs in November and 113 lbs this admit. Pt has lost 24lbs(18%) since September; this is severe weight loss. Per chart review, pt's UBW from 2017 appears to be around 172lbs. Pt drinking Ensure during last admit; RD will order supplements and MVI to help pt meet his estimated needs. Suspect pt at moderate to high refeed risk; pharmacy following for electrolytes. Palliative care following for GOC.   Medications reviewed and include: pepcid, lasix, heparin, cefepime, Mg  sulfate  Labs reviewed: Na 132(L), K 3.1(L), Cl 87(L) Mg 1.7 wnl- 1/16 Iron 24(L), TIBC 198(L), ferritin 332 wnl- 1/8 Hgb 7.9(L), Hct 24.7(L)  NUTRITION - FOCUSED PHYSICAL EXAM:    Most Recent Value  Orbital Region  Moderate depletion  Upper Arm Region  Moderate depletion  Thoracic and Lumbar Region  Moderate depletion  Buccal Region  Moderate depletion  Temple Region  Severe depletion  Clavicle Bone Region  Severe depletion  Clavicle and Acromion Bone Region  Severe depletion  Scapular Bone Region  Moderate depletion  Dorsal Hand  Moderate depletion  Patellar Region  Moderate depletion  Anterior Thigh Region  Mild depletion  Posterior Calf Region  Mild depletion  Edema (RD Assessment)  Moderate  Hair  Reviewed  Eyes  Reviewed  Mouth  Reviewed  Skin  Reviewed  Nails  Reviewed     Diet Order:   Diet Order            Diet regular Room service appropriate? Yes; Fluid consistency: Thin  Diet effective now             EDUCATION NEEDS:   Education needs have been addressed  Skin:  Skin Assessment: Reviewed RN Assessment(ecchymosis, abdominal puncture s/p biopsy )  Last BM:  1/16  Height:   Ht Readings from Last 1 Encounters:  09/28/18 5' 2.99" (1.6 m)    Weight:   Wt Readings from Last 1 Encounters:  09/29/18 49 kg    Ideal Body Weight:  56.3 kg  BMI:  Body mass index is 19.14 kg/m.  Estimated  Nutritional Needs:   Kcal:  1700-1900kcal/day   Protein:  77-87g/day   Fluid:  1.5L/day or per MD  Koleen Distance MS, RD, LDN Pager #- [phone] Office#- [phone] After Hours Pager: [phone]

## 2018-09-29 NOTE — Clinical Social Work Note (Signed)
Clinical Social Work Assessment  Patient Details  Name: Eugene Burton MRN: [id number] Date of Birth: Oct 05, 1960  Date of referral:  09/29/18               Reason for consult:  Discharge Planning                Permission sought to share information with:    Permission granted to share information::     Name::        Agency::     Relationship::     Contact Information:     Housing/Transportation Living arrangements for the past 2 months:  [place] of Information:  Other (Comment Required)(Dept of Social Services Supervisor: Eugene Burton: [phone] and [phone]) Patient Interpreter Needed:  Spanish Criminal Activity/Legal Involvement Pertinent to Current Situation/Hospitalization:  No - Comment as needed Significant Relationships:    Lives with:    Do you feel safe going back to the place where you live?    Need for family participation in patient care:  Yes (Comment)(and Eugene Burton)  Care giving concerns:  Patient has been residing at home. Wife recently passed and he is unable to care for himself.   Social Worker assessment / plan:  CSW notified by RN CM and Palliative Care that DSS is following patient and attempting to place patient. CSW contacted DSS APS caseworker: Eugene Burton but she referred me to her supervisor: Eugene Burton (numbers above) regarding the case. CSW spoke with [place] and she informed CSW that DSS became involved because he was unable to care for himself in the home and they were helping him with community resources. Eugene Burton informed  CSW that they have reserved a bed at [facility] for patient. CSW then began asking what is patient's mobility like. She was not aware. CSW explained that we would need to get PT to evaluate patient because he may not be appropriate for Assisted Living Level of care. Eugene Burton agreed that this was the best plan and that we would wait to do the FL2 until we know what level of care he will require. CSW has asked physician to order  PT.  Of note, patient also has a brother, Eugene Burton, that was reached by Palliative Care this afternoon: [phone] and [phone]. Patient has designated Eugene Burton to be his decision maker should there come a time when he cannot make decisions for himself.   Employment status:    Insurance information:    PT Recommendations:    Information / Referral to community resources:     Patient/Family's Response to care:  DSS expressed appreciation for CSW assistance.  Patient/Family's Understanding of and Emotional Response to Diagnosis, Current Treatment, and Prognosis:  Patient has talked with palliative care at length and is in agreement with the plan for placement with hospice.   Emotional Assessment Appearance:  Appears stated age Attitude/Demeanor/Rapport:    Affect (typically observed):    Orientation:    Alcohol / Substance use:  Not Applicable Psych involvement (Current and /or in the community):  No (Comment)  Discharge Needs  Concerns to be addressed:  Care Coordination Readmission within the last 30 days:  No Current discharge risk:  None Barriers to Discharge:  No Barriers Identified   Shela Leff, LCSW 09/29/2018, 4:54 PM

## 2018-09-29 NOTE — Assessment & Plan Note (Addendum)
#  58 year old male patient with newly diagnosed transverse colon adenocarcinoma metastatic; also synchronous pancreatic cancer; enlarged kidney mass is currently in the hospital for acute respiratory failure  #Metastatic transverse adeno colon ca; s/p omental biopsy-however imaging also shows pancreatic mass approximately 4 to 5 cm in size [highly suggestive of primary pancreatic malignancy with elevated CA-19-9]; and also kidney mass/cyst.  Given the multiple likely synchronous malignancies-the context of acute respiratory failure-patient a poor candidate for any palliative chemotherapy.   #Acute respiratory failure-COPD/fluid overload/pneumonia-currently on BiPAP  #Malignancy related pain-continue Vicodin for pain  # hearing/ speech impaired/ active smoker  Recommendations:   #Long discussion with patient-that given his multiple malignancies/poor performance status/acute respiratory issues-patient significantly high risk of adverse events from chemotherapy which okay benefits of chemotherapy.  Recommend hospice.  #Discussed genetic testing-patient in agreement.   #DNR/DNI-discussed; patient is ambivalent.  No decisions made.  Await palliative care evaluation.  Thank you Dr.Pyreddy for allowing me to participate in the care of your pleasant patient. Please do not hesitate to contact me with questions or concerns in the interim.  Discussed with Dr. Estanislado Pandy.  # 60 minutes face-to-face with the patient discussing the above plan of care; more than 50% of time spent on prognosis/ natural history; counseling and coordination.  Addendum: After extensive discussion with palliative care-patient agreed with hospice/DNR/DNI.  Likely be discharged to skilled facility with hospice services.  Discussed with Josh Borders; appreciate recommendations.

## 2018-09-29 NOTE — Progress Notes (Addendum)
CRITICAL CARE NOTE  CC  Follow up Respiratory Failure  SIGNIFICANT EVENTS/STUDIES 1/6 - CT Chest and Abd/Pelvis w/ contrast show multiple masses in Lung, Pancreatic head, Transverse Colon, Kidney 1/7 - Elevated tumor markers: CA 19-9 3,933 , CEA 145 1/9 - Omental Biopsy, results consistent with Colorectal origin 1/16 - Arrived at ED w/ worsening SOB. Chest X-ray shows extensive bilateral perihilar alveolar opacities. PNA vs Pulmonary Edema vs Lung masses 1/16 - Placed on BiPAP, transferred to ICU  1/17 - Off BiPAP at 9am, on [place] 2L O2  SUBJECTIVE -Communication established with pen and paper this morning -Off BiPAP at 9am, currently on [place] 2L O2 -4/10 abdominal and back pain -Reports breathing improved. Denies HA, SOB, chest pain -Eating breakfast w/o trouble, good appatite   BP 111/80   Pulse (!) 105   Temp 97.9 F (36.6 C) (Axillary)   Resp 14   Ht 5' 2.99" (1.6 m)   Wt 49 kg   SpO2 99%   BMI 19.14 kg/m    REVIEW OF SYSTEMS  Review of Systems  Constitutional: Negative for chills and fever.  HENT: Positive for hearing loss (Deaf).   Respiratory: Negative for cough, shortness of breath and wheezing.   Cardiovascular: Positive for leg swelling. Negative for chest pain.  Gastrointestinal: Positive for abdominal pain. Negative for nausea and vomiting.  Musculoskeletal: Positive for back pain.  Neurological: Negative for dizziness and headaches.    PHYSICAL EXAMINATION:  GENERAL:Critically ill, ill appearing, in no acute distress.   HEAD: Normocephalic, atraumatic.  EYES: Pupils equal, round, reactive to light.  No scleral icterus.  MOUTH: Moist mucosal membrane. NECK: Supple. No thyromegaly. No nodules. No JVD.  PULMONARY: On [place] 2L O2. Breathing comfortably. Diminished lung sound CARDIOVASCULAR: S1 and S2. Regular rate and rhythm. No murmurs, rubs, or gallops.  GASTROINTESTINAL: Soft, nontender, -distended. No masses. Positive bowel sounds. No hepatosplenomegaly.   MUSCULOSKELETAL: BLE 1+ edema.  NEUROLOGIC: Alert and awake. Follow simple commands SKIN:intact,warm,dry   ASSESSMENT AND PLAN SYNOPSIS  Patient is a 58 y.o. Deaf male presents to ICU with acute respiratory failure on BiPAP likely due to acute COPD exacerbation and multiple lung masses   Severe Hypoxic and Hypercapnic Respiratory Failure - resolving -Off BiPAP, currently on  2L O2 -Continue Bronchodilator Therapy -Wean Fio2 and PEEP as tolerated  Acute COPD exacerbation possibly on PNA vs Pulmonary Edema vs Lung Masses -Continue Lasix as tolerated -Discontinue Vancomycin, continue cefepine -Expectorated sputum culture pending -2/2 blood cultures show NO GROWTH < 12 hours  Anemia w/o acute blood loss -Hgb trends down 9.0-->7.9  -Transfuse if Hgb <7 -Monitor blood loss -Trend CBC  CARDIAC FAILURE -Oxygen as needed -Lasix as tolerated -Follow up cardiac enzymes as indicated -ECHO result pending  CARDIAC ICU monitoring  GI/Nutrition GI PROPHYLAXIS as indicated DIET--> regular diet Constipation protocol as indicated  ENDO -ICU hypoglycemic\Hyperglycemia protocol -Check FSBS per protocol  ELECTROLYTES -Follow labs as needed -Replace as needed -Pharmacy consultation and following  DVT/GI PRX ordered TRANSFUSIONS AS NEEDED MONITOR FSBS ASSESS the need for LABS as needed  Metastatic Cancer -Consult palliative care -Consult oncology  Pain Management -Morphine PRN    Overall, patient is critically ill, prognosis is guarded.  Remain SD status.   Jo-ku Teng, PA-Student   Pt seen and examined. Above note reviewed. He is off bipap with no resp distress and has spo2 > 92% on 2 lo2. He has stage IV metastatic cancer and is DNR and is being referred to hospioce Prognosis very poor.  Transfer to floor

## 2018-09-29 NOTE — Consult Note (Signed)
[place]  [phone] [phone]   Name: Eugene Burton Date: 09/29/2018 MRN: [id number]  DOB: 04-19-1961  Patient Care Team: Patient, No Pcp Per as PCP - General (General Practice) Clent Jacks, RN as Registered Nurse    REASON FOR CONSULTATION: Palliative Care consult requested for this 58 y.o. male with multiple medical problems including stage IV colon cancer, chronic respiratory failure requiring O2, deafness, malnutrition, GERD, and hypertension.  Patient was recently hospitalized 09/18/2018 to 09/22/2018 with cough and dehydration and was found to have multiple lung masses bilaterally on CT scan.  Abdominal CT revealed pancreatic lesions, liver lesions, a mass in the left kidney, and peritoneal carcinomatosis.  He underwent biopsy of the omental lesion with pathology showing metastatic adenocarcinoma likely of colorectal origin.  However, it is possible that patient has multiple primary cancers.  Patient is now admitted 09/28/17 with acute on chronic respiratory failure. CXR reveals extensive bilateral opacities possible due to lymphangitic spread of tumor vs multifocal PNA vs pulmonary edema. Patient initially required BIPAP. He was referred to palliative care to help address goals.  SOCIAL HISTORY:    Patient lives at home alone.  He was recently referred to APS due to self-neglect.  His wife, who was his primary caregiver, died in 06/03/18.  He has no children.  He has three brothers living but does not see them regularly.  He has a sister, who is also deaf and lives and [place].   ADVANCE DIRECTIVES:  Does not have  CODE STATUS: DNR  PAST MEDICAL HISTORY: Past Medical History:  Diagnosis Date  . COPD (chronic obstructive pulmonary disease) ([place])   . Deaf    patient needs interpreter   . Dyspnea   . GERD (gastroesophageal reflux disease)   . Hypertension     PAST SURGICAL HISTORY:  Past  Surgical History:  Procedure Laterality Date  . broken leg    . OPEN REDUCTION INTERNAL FIXATION (ORIF) CALCANEAL FRACTURE WITH FUSION Right    right lower leg surgery s/p fracture    HEMATOLOGY/ONCOLOGY HISTORY:  Oncology History   # JAN 2020- transverse adeno colon ca  # Pancreatic mass  # Kidney mass  # hearing/ speech impaired/ active smoker/ COPD       Adenocarcinoma of transverse colon ([place])   09/27/2018 Initial Diagnosis    Adenocarcinoma of transverse colon ([place])     ALLERGIES:  has No Known Allergies.  MEDICATIONS:  Current Facility-Administered Medications  Medication Dose Route Frequency Provider Last Rate Last Dose  . 0.9 %  sodium chloride infusion  250 mL Intravenous PRN Demetrios Loll, MD      . acetaminophen (TYLENOL) tablet 650 mg  650 mg Oral Q6H PRN Demetrios Loll, MD       Or  . acetaminophen (TYLENOL) suppository 650 mg  650 mg Rectal Q6H PRN Demetrios Loll, MD      . albuterol (PROVENTIL) (2.5 MG/3ML) 0.083% nebulizer solution 2.5 mg  2.5 mg Nebulization Q2H PRN Demetrios Loll, MD      . albuterol (PROVENTIL) (2.5 MG/3ML) 0.083% nebulizer solution 2.5 mg  2.5 mg Nebulization Q6H Demetrios Loll, MD   2.5 mg at 09/29/18 0729  . bisacodyl (DULCOLAX) EC tablet 5 mg  5 mg Oral Daily PRN Demetrios Loll, MD      . ceFEPIme (MAXIPIME) 1 g in sodium chloride 0.9 % 100 mL IVPB  1 g Intravenous Q8H Demetrios Loll, MD   Stopped at 09/29/18  [id number]  . famotidine (PEPCID) tablet 20 mg  20 mg Oral Daily Demetrios Loll, MD   20 mg at 09/29/18 1138  . feeding supplement (ENSURE ENLIVE) (ENSURE ENLIVE) liquid 237 mL  237 mL Oral TID BM Rosine Door, MD      . furosemide (LASIX) injection 40 mg  40 mg Intravenous Q12H Demetrios Loll, MD   40 mg at 09/29/18 0710  . guaiFENesin-codeine 100-10 MG/5ML solution 10 mL  10 mL Oral Q6H PRN Demetrios Loll, MD      . heparin injection 5,000 Units  5,000 Units Subcutaneous Camelia Phenes Demetrios Loll, MD   5,000 Units at 09/29/18 0545  . hydrALAZINE (APRESOLINE) injection 10 mg  10 mg  Intravenous Q6H PRN Demetrios Loll, MD      . HYDROcodone-acetaminophen (NORCO/VICODIN) 5-325 MG per tablet 1-2 tablet  1-2 tablet Oral Q4H PRN Demetrios Loll, MD      . losartan (COZAAR) tablet 50 mg  50 mg Oral Daily Demetrios Loll, MD   50 mg at 09/29/18 1138  . magnesium sulfate IVPB 2 g 50 mL  2 g Intravenous Once Rosine Door, MD 50 mL/hr at 09/29/18 1215 2 g at 09/29/18 1215  . morphine 2 MG/ML injection 1-2 mg  1-2 mg Intravenous Q4H PRN Awilda Bill, NP   2 mg at 09/28/18 1727  . morphine 4 MG/ML injection 4 mg  4 mg Intravenous Q4H PRN Demetrios Loll, MD      . Derrill Memo ON 09/30/2018] multivitamin with minerals tablet 1 tablet  1 tablet Oral Daily Rosine Door, MD      . nebivolol (BYSTOLIC) tablet 5 mg  5 mg Oral Daily Demetrios Loll, MD   5 mg at 09/29/18 1139  . nitroGLYCERIN 50 mg in dextrose 5 % 250 mL (0.2 mg/mL) infusion  50 mcg/min Intravenous Continuous Earleen Newport, MD   Stopped at 09/28/18 1334  . ondansetron (ZOFRAN) tablet 4 mg  4 mg Oral Q6H PRN Demetrios Loll, MD       Or  . ondansetron [facility] injection 4 mg  4 mg Intravenous Q6H PRN Demetrios Loll, MD      . pneumococcal 23 valent vaccine (PNU-IMMUNE) injection 0.5 mL  0.5 mL Intramuscular Tomorrow-1000 Awilda Bill, NP   Stopped at 09/29/18 1140  . senna-docusate (Senokot-S) tablet 1 tablet  1 tablet Oral QHS PRN Demetrios Loll, MD      . sodium chloride flush (NS) 0.9 % injection 3 mL  3 mL Intravenous Q12H Demetrios Loll, MD   3 mL at 09/29/18 1140  . sodium chloride flush (NS) 0.9 % injection 3 mL  3 mL Intravenous PRN Demetrios Loll, MD        VITAL SIGNS: BP 132/88   Pulse (!) 106   Temp 97.9 F (36.6 C) (Axillary)   Resp 20   Ht 5' 2.99" (1.6 m)   Wt 108 lb 0.4 oz (49 kg)   SpO2 98%   BMI 19.14 kg/m  Filed Weights   09/28/18 1257 09/28/18 1626 09/29/18 0425  Weight: 113 lb (51.3 kg) 113 lb 5.1 oz (51.4 kg) 108 lb 0.4 oz (49 kg)    Estimated body mass index is 19.14 kg/m as calculated from the following:   Height as of  this encounter: 5' 2.99" (1.6 m).   Weight as of this encounter: 108 lb 0.4 oz (49 kg).  LABS: CBC:    Component Value Date/Time   WBC 4.0 09/29/2018 0359   HGB 7.9 (L) 09/29/2018  0359   HGB 12.0 (L) 06/01/2018 1940   HCT 24.7 (L) 09/29/2018 0359   HCT 36.7 (L) 06/01/2018 1940   PLT 137 (L) 09/29/2018 0359   PLT 382 06/01/2018 1940   MCV 84.3 09/29/2018 0359   MCV 88 06/01/2018 1940   NEUTROABS 10.9 (H) 09/28/2018 1251   NEUTROABS 5.5 06/01/2018 1940   LYMPHSABS 0.9 09/28/2018 1251   LYMPHSABS 1.6 06/01/2018 1940   MONOABS 1.2 (H) 09/28/2018 1251   EOSABS 0.2 09/28/2018 1251   EOSABS 0.1 06/01/2018 1940   BASOSABS 0.0 09/28/2018 1251   BASOSABS 0.0 06/01/2018 1940   Comprehensive Metabolic Panel:    Component Value Date/Time   NA 132 (L) 09/29/2018 0359   NA 141 06/01/2018 1940   K 3.1 (L) 09/29/2018 0359   CL 87 (L) 09/29/2018 0359   CO2 35 (H) 09/29/2018 0359   BUN 14 09/29/2018 0359   BUN 11 06/01/2018 1940   CREATININE 0.84 09/29/2018 0359   GLUCOSE 195 (H) 09/29/2018 0359   CALCIUM 8.3 (L) 09/29/2018 0359   AST 24 09/18/2018 1806   ALT 17 09/18/2018 1806   ALKPHOS 279 (H) 09/18/2018 1806   BILITOT 0.6 09/18/2018 1806   BILITOT 0.3 06/01/2018 1940   PROT 6.3 (L) 09/18/2018 1806   PROT 6.5 06/01/2018 1940   ALBUMIN 3.0 (L) 09/18/2018 1806   ALBUMIN 4.1 06/01/2018 1940    RADIOGRAPHIC STUDIES: Dg Chest 2 View  Result Date: 09/18/2018 CLINICAL DATA:  Cough. Weight loss, patient has been feeling poorly for 3 months. EXAM: CHEST - 2 VIEW COMPARISON:  Remote chest radiograph 04/07/2008 FINDINGS: Heart is normal in size. Diffuse peribronchial and interstitial thickening. Slight bilateral hilar prominence. No confluent airspace disease or radiographic findings of mass. Minimal fluid in the fissures without subpulmonic effusion. No acute osseous abnormalities are seen. IMPRESSION: 1. Diffuse peribronchial interstitial thickening with diagnostic considerations of  pulmonary edema or bronchitis. 2. Mild bilateral hilar prominence is nonspecific, may be vascular overlap versus adenopathy. Consider further evaluation with chest CT with contrast for further evaluation. 3. No radiographic findings of pneumonia or pulmonary mass. Electronically Signed   By: Keith Rake M.D.   On: 09/18/2018 22:39   Ct Chest W Contrast  Result Date: 09/18/2018 EXAM: CT CHEST, ABDOMEN, AND PELVIS WITH CONTRAST TECHNIQUE: Multidetector CT imaging of the chest, abdomen and pelvis was performed following the standard protocol during bolus administration of intravenous contrast. CONTRAST:  14mL OMNIPAQUE IOHEXOL 300 MG/ML  SOLN COMPARISON:  Same day CXR FINDINGS: CT CHEST FINDINGS Cardiovascular: Conventional branch pattern of the great vessels. Nonaneurysmal thoracic aorta. No large central pulmonary embolus. Heart size is top normal without pericardial effusion or thickening. Mediastinum/Nodes: Mild thyromegaly with retroclavicular extension of the thyroid gland. No dominant mass. Midline patent trachea. Mainstem bronchi appear patent. Mild enlargement of mediastinal lymph nodes in the right upper paratracheal, prevascular, left lower paratracheal and bilateral hilar lymph node stations ranging in size from 10 mm through 12 mm. The CT appearance of the esophagus is unremarkable. Lungs/Pleura: Interstitial thickening likely representing interstitial edema with ill-defined patchy multilobar masslike bilateral airspace opacities and peribronchial thickening are identified bilaterally. The largest is noted in the superior segment of left lower lobe measuring approximately 2 x 2.4 x 1.8 cm with smaller ill-defined nodular opacities scattered throughout both lungs. No effusion or pneumothorax. Musculoskeletal: No chest wall mass or suspicious bone lesions identified. CT ABDOMEN PELVIS FINDINGS Hepatobiliary: Concerning ill-defined hypodense lesion in the posterior segment of right hepatic lobe  measuring  4.1 x 3.7 x 4.7 cm in transverse by AP by craniocaudad suspicious for hepatic metastasis. No biliary dilatation. Nondistended gallbladder containing a 1.6 cm gallstone. Pancreas: Hypodense ill-defined mass measuring 4.8 x 4.5 x 4.4 cm centered within the body of the pancreas concerning for pancreatic carcinoma. No ductal dilatation. Patent portal and splenic veins. Spleen: Normal Adrenals/Urinary Tract: Bilobed hypodense masslike abnormality in the left adrenal gland likely representing metastatic disease measuring at least 3.8 x 1.6 cm, series 2/49. Exophytic complex cystic mass arising lower pole of the left kidney measuring 4.5 x 3.6 x 5 cm with mural nodule along the caudal aspect measuring up to 1.2 x 1.5 x 1.8 cm. Hypodense lesions of the right kidney are more likely to represent simple cysts though some are too small to further characterize. The largest is in the lower pole posteriorly measuring 1.8 cm in diameter. Stomach/Bowel: The stomach is nondistended. Normal small bowel rotation. Slight fluid-filled distention of distal and mid small bowel loops without mechanical obstruction. Normal appendix, terminal and distal ileum. Moderate transmural thickening of the transverse colon with ill-defined masslike abnormality involving the distal transverse colon measuring 5.2 x 7.4 x 2.9 cm, series 2/59 with single wall thickness up to 2.4 cm. There are adjacent peritoneal masses concerning for peritoneal carcinomatosis measuring 1.7 cm in the left hemiabdomen, series 2/60 with smaller mesenteric nodules in the left upper quadrant measuring up to 0.8 cm and 1.4 cm. Mesenteric edema and mesenteric nodules are also noted further caudad within the left hemiabdomen, the largest measuring up to 2 cm, series 2/68. Smaller left pelvic mesenteric nodule measuring 0.8 cm is seen on the left. Vascular/Lymphatic: Mild aortoiliac atherosclerosis without aneurysm or dissection. No retroperitoneal or definite mesenteric  adenopathy. Reproductive: Prostate is unremarkable. Other: No free air or free fluid. Musculoskeletal: Suspicious osteolytic appearance of the right femoral head on axial image 107, series 2. A more circumscribed left femoral neck lesion measuring up to 1.5 cm with ill-defined right posterior iliac bone 2.1 cm lesion. IMPRESSION: Chest CT: 1. Ill-defined masslike opacities scattered throughout both lungs with associated hilar and mediastinal lymphadenopathy concerning for metastatic disease. Given the masslike abnormalities noted within the abdomen involving the pancreas and transverse colon, suspect that these are more likely metastatic and not due to a primary pulmonary neoplasm. 2. Nonaneurysmal thoracic aorta. 3. No acute pulmonary embolus. CT AP: 1. Hypodense mass centered within the body of the pancreas measuring 4.8 x 4.5 x 4.4 cm concerning for pancreatic carcinoma. 2. Masslike abnormality of the distal transverse colon measuring 5.2 x 7.4 x 2.9 cm with single wall thickness up to 2.4 cm. No bowel obstruction. This is also concerning for colonic neoplasm. 3. Hypodense mass of the right hepatic lobe measuring 4.1 x 3.7 x 4.7 cm concerning for hepatic metastasis. 4. Cystic mass arising off the lower pole of the left kidney with solid nodular component measuring 4.5 x 3.6 x 5 cm with the mural nodule measuring 1.2 x 1.5 x 1.8 cm. Cystic renal cell carcinoma is not excluded. 5. Findings concerning for peritoneal carcinomatosis with mesenteric nodules as above described. No ascites however is identified. 6. 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 These results were called by telephone at the time of interpretation on 09/18/2018 at 11:58 pm to Dr. Mable Paris , who verbally acknowledged these results. Electronically Signed   By: Ashley Royalty M.D.   On: 09/18/2018 23:58   Ct Abdomen Pelvis W Contrast  Result Date: 09/18/2018 EXAM: CT  CHEST, ABDOMEN, AND PELVIS WITH CONTRAST TECHNIQUE:  Multidetector CT imaging of the chest, abdomen and pelvis was performed following the standard protocol during bolus administration of intravenous contrast. CONTRAST:  40mL OMNIPAQUE IOHEXOL 300 MG/ML  SOLN COMPARISON:  Same day CXR FINDINGS: CT CHEST FINDINGS Cardiovascular: Conventional branch pattern of the great vessels. Nonaneurysmal thoracic aorta. No large central pulmonary embolus. Heart size is top normal without pericardial effusion or thickening. Mediastinum/Nodes: Mild thyromegaly with retroclavicular extension of the thyroid gland. No dominant mass. Midline patent trachea. Mainstem bronchi appear patent. Mild enlargement of mediastinal lymph nodes in the right upper paratracheal, prevascular, left lower paratracheal and bilateral hilar lymph node stations ranging in size from 10 mm through 12 mm. The CT appearance of the esophagus is unremarkable. Lungs/Pleura: Interstitial thickening likely representing interstitial edema with ill-defined patchy multilobar masslike bilateral airspace opacities and peribronchial thickening are identified bilaterally. The largest is noted in the superior segment of left lower lobe measuring approximately 2 x 2.4 x 1.8 cm with smaller ill-defined nodular opacities scattered throughout both lungs. No effusion or pneumothorax. Musculoskeletal: No chest wall mass or suspicious bone lesions identified. CT ABDOMEN PELVIS FINDINGS Hepatobiliary: Concerning ill-defined hypodense lesion in the posterior segment of right hepatic lobe measuring 4.1 x 3.7 x 4.7 cm in transverse by AP by craniocaudad suspicious for hepatic metastasis. No biliary dilatation. Nondistended gallbladder containing a 1.6 cm gallstone. Pancreas: Hypodense ill-defined mass measuring 4.8 x 4.5 x 4.4 cm centered within the body of the pancreas concerning for pancreatic carcinoma. No ductal dilatation. Patent portal and splenic veins. Spleen: Normal Adrenals/Urinary Tract: Bilobed hypodense masslike abnormality  in the left adrenal gland likely representing metastatic disease measuring at least 3.8 x 1.6 cm, series 2/49. Exophytic complex cystic mass arising lower pole of the left kidney measuring 4.5 x 3.6 x 5 cm with mural nodule along the caudal aspect measuring up to 1.2 x 1.5 x 1.8 cm. Hypodense lesions of the right kidney are more likely to represent simple cysts though some are too small to further characterize. The largest is in the lower pole posteriorly measuring 1.8 cm in diameter. Stomach/Bowel: The stomach is nondistended. Normal small bowel rotation. Slight fluid-filled distention of distal and mid small bowel loops without mechanical obstruction. Normal appendix, terminal and distal ileum. Moderate transmural thickening of the transverse colon with ill-defined masslike abnormality involving the distal transverse colon measuring 5.2 x 7.4 x 2.9 cm, series 2/59 with single wall thickness up to 2.4 cm. There are adjacent peritoneal masses concerning for peritoneal carcinomatosis measuring 1.7 cm in the left hemiabdomen, series 2/60 with smaller mesenteric nodules in the left upper quadrant measuring up to 0.8 cm and 1.4 cm. Mesenteric edema and mesenteric nodules are also noted further caudad within the left hemiabdomen, the largest measuring up to 2 cm, series 2/68. Smaller left pelvic mesenteric nodule measuring 0.8 cm is seen on the left. Vascular/Lymphatic: Mild aortoiliac atherosclerosis without aneurysm or dissection. No retroperitoneal or definite mesenteric adenopathy. Reproductive: Prostate is unremarkable. Other: No free air or free fluid. Musculoskeletal: Suspicious osteolytic appearance of the right femoral head on axial image 107, series 2. A more circumscribed left femoral neck lesion measuring up to 1.5 cm with ill-defined right posterior iliac bone 2.1 cm lesion. IMPRESSION: Chest CT: 1. Ill-defined masslike opacities scattered throughout both lungs with associated hilar and mediastinal  lymphadenopathy concerning for metastatic disease. Given the masslike abnormalities noted within the abdomen involving the pancreas and transverse colon, suspect that these are more likely metastatic  and not due to a primary pulmonary neoplasm. 2. Nonaneurysmal thoracic aorta. 3. No acute pulmonary embolus. CT AP: 1. Hypodense mass centered within the body of the pancreas measuring 4.8 x 4.5 x 4.4 cm concerning for pancreatic carcinoma. 2. Masslike abnormality of the distal transverse colon measuring 5.2 x 7.4 x 2.9 cm with single wall thickness up to 2.4 cm. No bowel obstruction. This is also concerning for colonic neoplasm. 3. Hypodense mass of the right hepatic lobe measuring 4.1 x 3.7 x 4.7 cm concerning for hepatic metastasis. 4. Cystic mass arising off the lower pole of the left kidney with solid nodular component measuring 4.5 x 3.6 x 5 cm with the mural nodule measuring 1.2 x 1.5 x 1.8 cm. Cystic renal cell carcinoma is not excluded. 5. Findings concerning for peritoneal carcinomatosis with mesenteric nodules as above described. No ascites however is identified. 6. 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 These results were called by telephone at the time of interpretation on 09/18/2018 at 11:58 pm to Dr. Mable Paris , who verbally acknowledged these results. Electronically Signed   By: Ashley Royalty M.D.   On: 09/18/2018 23:58   Ct Biopsy  Result Date: 09/21/2018 CLINICAL DATA:  Pancreatic mass, peritoneal masses, liver mass, left renal mass, left adrenal mass and pulmonary masses. 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 Presenting for biopsy of one of the peritoneal masses. EXAM: CT GUIDED CORE BIOPSY OF PERITONEAL MASS ANESTHESIA/SEDATION: 2.0 mg IV Versed; 75 mcg IV Fentanyl Total Moderate Sedation Time:  42 minutes. The patient's level of consciousness and physiologic status were continuously monitored during the procedure by Radiology nursing. PROCEDURE: The procedure risks,  benefits, and alternatives were explained to the patient. Questions regarding the procedure were encouraged and answered. The patient understands and consents to the procedure. A time-out was performed prior to initiating the procedure. The operative field was prepped with chlorhexidine in a sterile fashion, and a sterile drape was applied covering the operative field. A sterile gown and sterile gloves were used for the procedure. Local anesthesia was provided with 1% Lidocaine. CT was performed in a supine position through the mid to lower abdomen. A site was chosen for biopsy along the left abdominal wall. Under CT guidance, a 17 gauge trocar needle was advanced. After confirming needle tip position, a total of 4 coaxial 18 gauge core biopsy samples were obtained and submitted in formalin. Additional CT was performed after biopsy. COMPLICATIONS: None FINDINGS: Peritoneal soft tissue nodule in the left mid lateral peritoneal cavity was chosen for biopsy and measures approximately 2 cm in maximal diameter. Solid tissue was obtained. There were no immediate complications. IMPRESSION: CT-guided core biopsy performed of peritoneal mass in the left mid lateral abdomen. Electronically Signed   By: Aletta Edouard M.D.   On: 09/21/2018 14:19   Dg Chest Port 1 View  Result Date: 09/29/2018 CLINICAL DATA:  Acute respiratory failure EXAM: PORTABLE CHEST 1 VIEW COMPARISON:  Chest radiograph dated 09/28/2018. CT chest dated 09/18/2018. FINDINGS: Increased interstitial markings in the lungs bilaterally. When correlating with recent CT, this appearance favors metastatic disease, although superimposed infection or interstitial edema is not excluded. No definite pleural effusions. No pneumothorax. The heart is normal in size. IMPRESSION: Increased interstitial markings in the lungs bilaterally, favoring metastatic disease when correlating with recent CT. Electronically Signed   By: Julian Hy M.D.   On: 09/29/2018 03:39    Dg Chest Port 1 View  Result Date: 09/28/2018 CLINICAL DATA:  Dyspnea.  History of COPD and lung carcinoma. EXAM: PORTABLE CHEST 1 VIEW COMPARISON:  CT chest, abdomen and pelvis and PA and lateral chest 09/18/2018. FINDINGS: Extensive bilateral interstitial and alveolar opacities or predominantly perihilar and appear somewhat worse than on the comparison examination. Heart size is normal. No pneumothorax or pleural effusion. No acute bony abnormality. IMPRESSION: Extensive bilateral perihilar alveolar opacities could be due to pulmonary edema, multifocal pneumonia or possibly lymphangitic spread of tumor. Electronically Signed   By: Inge Rise M.D.   On: 09/28/2018 13:19    PERFORMANCE STATUS (ECOG) : 3 - Symptomatic, >50% confined to bed  Review of Systems As noted above. Otherwise, a complete review of systems is negative.  Physical Exam General: frail appearing  Cardiovascular: regular rate and rhythm Pulmonary: clear ant fields, on O2 Abdomen: soft, nontender, + bowel sounds GU: no suprapubic tenderness Extremities: LE edema Skin: no rashes Neurological: Weakness but otherwise nonfocal  IMPRESSION: I had a lengthy meeting with patient, friend Jana Half) and another friend. I used both an ALS and deaf interpreter during my meeting.   Patient currently knows he is in the hospital and is able to tell me some details surrounding his clinical status and illness. He is currently oriented x 4. However, he reportedly only completed the third grade and has some comprehension difficulties with some complex topics. The interpreter shared that he communicates well via ASL and likely has a high-school level language ability.   Together, we extensively reviewed patient's current medical problems and I attempted to elicit patient's goals. He recognizes that he has cancer but says he was not aware that it was in multiple locations. I explained that his cancer was extensive and that he was not  felt to be a good candidate for treatment. We talked about the likelihood that he is nearing end of life. Patient says that he understood all of this. We also discussed the option of hospice and both he and his friend agreed hospice would be appropriate. Patient does not plan to return home. Of note, his house is currently in foreclosure. DSS was already working on placement at [facility]. Patient would agree with ALF and hospice following in that location. He would prefer to stay locally where he can communicate with several of his deaf friends.   We discussed code status at length. Patient says he would not want to be resuscitated or have his life prolonged artificially via machines. This was witnessed by [employer], RN. Will change code status to DNR.   I called and spoke with Alberteen Sam, supervisor at [facility]. I updated her on my conversation with patient. They had been weighing the need for a temporary protection order for decision making. However, I think patient can currently make his own decisions, although capacity can clearly change rapidly with his clinical status.   Patient says he would want his brother, Ovid Curd, to be his decision maker if needed. I called and spoke with Ovid Curd and updated him on patient's status and decisions (hospice and DNR). Ovid Curd can be reached at: [phone] or [phone].   Will consult chaplains to help [place] establish HCPOA.   Patient would like genetic testing given his extensive family history of cancer. I obtained a blood sample with patient's consent and transported it to the [facility] for send out testing.   Case discussed with Drs. Brahmanday and Pyreddy.   PLAN: Continue current scope of treatment DNR HCPOA to be completed by chaplain APS following Placement in ALF  SW to coordinate  discharge Hospice referral  Time Total: 120 minutes  Visit consisted of counseling and education dealing with the complex and emotionally intense issues of symptom  management and palliative care in the setting of serious and potentially life-threatening illness.Greater than 50%  of this time was spent counseling and coordinating care related to the above assessment and plan.  Signed by: Altha Harm, PhD, NP-C [phone] (Work Cell)

## 2018-09-29 NOTE — Progress Notes (Signed)
   09/29/18 2000  Clinical Encounter Type  Visited With Patient and family together  Visit Type Follow-up  Referral From Nurse  Spiritual Encounters  Spiritual Needs Prayer  Stress Factors  Family Stress Factors Major life changes  Ch followed up with the family on AD. Family experienced several losses in the past few months. Ch prayed with the family and offered emotional support. Pt will finalize AD tomorrow morning.

## 2018-09-30 LAB — CBC WITH DIFFERENTIAL/PLATELET
Abs Immature Granulocytes: 0.05 10*3/uL (ref 0.00–0.07)
BASOS ABS: 0 10*3/uL (ref 0.0–0.1)
Basophils Relative: 0 %
Eosinophils Absolute: 0.1 10*3/uL (ref 0.0–0.5)
Eosinophils Relative: 1 %
HCT: 25.1 % — ABNORMAL LOW (ref 39.0–52.0)
Hemoglobin: 8.2 g/dL — ABNORMAL LOW (ref 13.0–17.0)
Immature Granulocytes: 1 %
Lymphocytes Relative: 6 %
Lymphs Abs: 0.6 10*3/uL — ABNORMAL LOW (ref 0.7–4.0)
MCH: 27.3 pg (ref 26.0–34.0)
MCHC: 32.7 g/dL (ref 30.0–36.0)
MCV: 83.7 fL (ref 80.0–100.0)
Monocytes Absolute: 0.9 10*3/uL (ref 0.1–1.0)
Monocytes Relative: 10 %
Neutro Abs: 7.8 10*3/uL — ABNORMAL HIGH (ref 1.7–7.7)
Neutrophils Relative %: 82 %
PLATELETS: 168 10*3/uL (ref 150–400)
RBC: 3 MIL/uL — AB (ref 4.22–5.81)
RDW: 18.4 % — ABNORMAL HIGH (ref 11.5–15.5)
WBC: 9.5 10*3/uL (ref 4.0–10.5)
nRBC: 0 % (ref 0.0–0.2)

## 2018-09-30 LAB — BASIC METABOLIC PANEL
Anion gap: 9 (ref 5–15)
BUN: 17 mg/dL (ref 6–20)
CO2: 35 mmol/L — ABNORMAL HIGH (ref 22–32)
Calcium: 8.3 mg/dL — ABNORMAL LOW (ref 8.9–10.3)
Chloride: 87 mmol/L — ABNORMAL LOW (ref 98–111)
Creatinine, Ser: 0.74 mg/dL (ref 0.61–1.24)
GFR calc Af Amer: >60 mL/min (ref >60)
GFR calc non Af Amer: >60 mL/min (ref >60)
Glucose, Bld: 145 mg/dL — ABNORMAL HIGH (ref 70–99)
POTASSIUM: 3 mmol/L — AB (ref 3.5–5.1)
Sodium: 131 mmol/L — ABNORMAL LOW (ref 135–145)

## 2018-09-30 LAB — PHOSPHORUS: Phosphorus: 3.3 mg/dL (ref 2.5–4.6)

## 2018-09-30 LAB — MAGNESIUM: MAGNESIUM: 2.3 mg/dL (ref 1.7–2.4)

## 2018-09-30 MED ORDER — POTASSIUM CHLORIDE 10 MEQ/100ML IV SOLN
10.0000 meq | INTRAVENOUS | Status: AC
  Administered 2018-09-30 (×4): 10 meq via INTRAVENOUS
  Filled 2018-09-30 (×4): qty 100

## 2018-09-30 MED ORDER — HYDROCOD POLST-CPM POLST ER 10-8 MG/5ML PO SUER
5.0000 mL | Freq: Two times a day (BID) | ORAL | Status: DC | PRN
  Administered 2018-09-30 (×2): 5 mL via ORAL
  Filled 2018-09-30 (×2): qty 5

## 2018-09-30 MED ORDER — POTASSIUM CHLORIDE CRYS ER 20 MEQ PO TBCR: 40.0000 meq | EXTENDED_RELEASE_TABLET | Freq: Once | ORAL | Status: DC

## 2018-09-30 NOTE — Progress Notes (Signed)
Chaplain visited w/ pt to finalize adv directive. Used interpreter services to communicate w/ pt and friends that visited pt. Pt was tearful when we discussed his brother who just had surgery. Pt shared how he has limited support from family but had several friends who were also deaf come visit w/ him. Ch tried to encourage pt to finalize adv directive hear but friend of the pt declined and stated they would finalize it outside the hospital. F/u recommended when visitors are limited.      09/30/18 1100  Clinical Encounter Type  Visited With Patient and family together;Patient  Visit Type Follow-up;Spiritual support;Social support;Other (Comment) (finalizing Adv directive )  Referral From Chaplain;Palliative care team  Consult/Referral To Palliative care  Recommendations f/u from pallidative care recommended   Spiritual Encounters  Spiritual Needs Emotional;Grief support  Stress Factors  Patient Stress Factors Health changes;Lack of caregivers;Loss;Loss of control;Major life changes;Family relationships  Family Stress Factors Family relationships;Health changes;Loss;Loss of control;Major life changes  Advance Directives (For Healthcare)  Would patient like information on creating a medical advance directive? Yes (ED - Information included in AVS)

## 2018-09-30 NOTE — Progress Notes (Signed)
F/u with pt after a pg related to the adv directive. Met w/ family and finalized the document. Family shared that they were concerned about the visitors that were present earlier today and that the pt just lost his wife and a another family member recently. Pt was glad to have paperwork finalized and to be in company w/ his family. Family was grateful of ch visit f/u recommended.    09/30/18 1745  Clinical Encounter Type  Visited With Patient and family together  Visit Type Follow-up;Spiritual support;Social support  Referral From Nurse  Consult/Referral To Chaplain  Recommendations limit visits to family per request of family   Spiritual Encounters  Spiritual Needs Emotional;Grief support  Stress Factors  Patient Stress Factors Family relationships;Financial concerns;Loss;Loss of control;Major life changes  Family Stress Factors Major life changes;Loss  Advance Directives (For Healthcare)  Does Patient Have a Medical Advance Directive? Yes  Does patient want to make changes to medical advance directive? No - Patient declined  Type of [facility] of [place];Living will  Copy of [place] in Chart? Yes - validated most recent copy scanned in chart (See row information)  Copy of Living Will in Chart? Yes - validated most recent copy scanned in chart (See row information)  Would patient like information on creating a medical advance directive? No - Patient declined

## 2018-09-30 NOTE — Progress Notes (Signed)
[facility] at [place]: Harlem Bula    MR#:  [id number]  DATE OF BIRTH:  [birth year]  SUBJECTIVE:  CHIEF COMPLAINT:   Chief Complaint  Patient presents with  . Shortness of Breath  Patient seen and evaluated today On oxygen via nasal cannula Has shortness of breath Cough occasionally  REVIEW OF SYSTEMS:    ROS  CONSTITUTIONAL: No documented fever. No fatigue, weakness. No weight gain, no weight loss.  EYES: No blurry or double vision.  ENT: No tinnitus. No postnasal drip. No redness of the oropharynx.  RESPIRATORY: occasional cough, no wheeze, no hemoptysis. Has dyspnea.  CARDIOVASCULAR: No chest pain. No orthopnea. No palpitations. No syncope.  GASTROINTESTINAL: No nausea, no vomiting or diarrhea. No abdominal pain. No melena or hematochezia.  GENITOURINARY: No dysuria or hematuria.  ENDOCRINE: No polyuria or nocturia. No heat or cold intolerance.  HEMATOLOGY: No anemia. No bruising. No bleeding.  INTEGUMENTARY: No rashes. No lesions.  MUSCULOSKELETAL: No arthritis. No swelling. No gout.  NEUROLOGIC: No numbness, tingling, or ataxia. No seizure-type activity.  PSYCHIATRIC: No anxiety. No insomnia. No ADD.   DRUG ALLERGIES:  No Known Allergies  VITALS:  Blood pressure (!) 125/98, pulse 95, temperature 98 F (36.7 C), temperature source Oral, resp. rate (!) 25, height 5' 2.99" (1.6 m), weight 48.2 kg, SpO2 93 %.  PHYSICAL EXAMINATION:   Physical Exam  GENERAL:  58 y.o.-year-old patient lying in the bed with no acute distress.  EYES: Pupils equal, round, reactive to light and accommodation. No scleral icterus. Extraocular muscles intact.  HEENT: Head atraumatic, normocephalic. Oropharynx and nasopharynx clear.  NECK:  Supple, no jugular venous distention. No thyroid enlargement, no tenderness.  LUNGS: Decreased breath sounds bilaterally,. No use of accessory muscles of respiration.  Scattered Rales heard in both  lungs CARDIOVASCULAR: S1, S2 normal. No murmurs, rubs, or gallops.  ABDOMEN: Soft, nontender, nondistended. Bowel sounds present. No organomegaly or mass.  EXTREMITIES: No cyanosis, clubbing or edema b/l.    NEUROLOGIC: Cranial nerves II through XII are intact. No focal Motor or sensory deficits b/l.   PSYCHIATRIC: The patient is alert and oriented x 3.  SKIN: No obvious rash, lesion, or ulcer.   LABORATORY PANEL:   CBC Recent Labs  Lab 09/30/18 0404  WBC 9.5  HGB 8.2*  HCT 25.1*  PLT 168   ------------------------------------------------------------------------------------------------------------------ Chemistries  Recent Labs  Lab 09/30/18 0404  NA 131*  K 3.0*  CL 87*  CO2 35*  GLUCOSE 145*  BUN 17  CREATININE 0.74  CALCIUM 8.3*  MG 2.3   ------------------------------------------------------------------------------------------------------------------  Cardiac Enzymes Recent Labs  Lab 09/28/18 1251  TROPONINI <0.03   ------------------------------------------------------------------------------------------------------------------  RADIOLOGY:  Dg Chest Port 1 View  Result Date: 09/29/2018 CLINICAL DATA:  Acute respiratory failure EXAM: PORTABLE CHEST 1 VIEW COMPARISON:  Chest radiograph dated 09/28/2018. CT chest dated 09/18/2018. FINDINGS: Increased interstitial markings in the lungs bilaterally. When correlating with recent CT, this appearance favors metastatic disease, although superimposed infection or interstitial edema is not excluded. No definite pleural effusions. No pneumothorax. The heart is normal in size. IMPRESSION: Increased interstitial markings in the lungs bilaterally, favoring metastatic disease when correlating with recent CT. Electronically Signed   By: Julian Hy M.D.   On: 09/29/2018 03:39   Dg Chest Port 1 View  Result Date: 09/28/2018 CLINICAL DATA:  Dyspnea.  History of COPD and lung carcinoma. EXAM: PORTABLE CHEST 1 VIEW  COMPARISON:  CT chest, abdomen and pelvis and  PA and lateral chest 09/18/2018. FINDINGS: Extensive bilateral interstitial and alveolar opacities or predominantly perihilar and appear somewhat worse than on the comparison examination. Heart size is normal. No pneumothorax or pleural effusion. No acute bony abnormality. IMPRESSION: Extensive bilateral perihilar alveolar opacities could be due to pulmonary edema, multifocal pneumonia or possibly lymphangitic spread of tumor. Electronically Signed   By: Inge Rise M.D.   On: 09/28/2018 13:19     ASSESSMENT AND PLAN:  58 year old male patient with history of stage IV pancreatic cancer, recently diagnosed metastatic lung cancer, deafness, numbness, hypertension currently under hospitalist service for shortness of breath  -Acute on chronic respiratory failure with hypoxia Secondary to metastatic cancer and pneumonia Weaned off BiPAP On oxygen via nasal cannula Nebulization treatments to continue Antitussive medication Oncology follow-up  -Acute pulmonary edema Diurese with Lasix Monitor electrolytes  -Pneumonia improving slowly Continue  IV cefepime antibiotic Follow-up cultures MRSA PCR is negative  -Metastatic cancer Patient is DNR by [place] with hospice services disposition once all things are set up Social worker follow-up  All the records are reviewed and case discussed with Care Management/Social Worker. Management plans discussed with the patient, family and they are in agreement.  CODE STATUS: DNR  DVT Prophylaxis: SCDs  TOTAL TIME TAKING CARE OF THIS PATIENT: 35 minutes.   POSSIBLE D/C IN 2 to 3 DAYS, DEPENDING ON CLINICAL CONDITION.  Saundra Shelling M.D on 09/30/2018 at 12:10 PM  Between 7am to 6pm - Pager - [phone]  After 6pm go to [email or web address] - password [email or web address] [facility]  Office  [phone]  CC: Primary care physician; Patient, No Pcp Per  Note: This dictation was  prepared with Dragon dictation along with smaller phrase technology. Any transcriptional errors that result from this process are unintentional.

## 2018-09-30 NOTE — Progress Notes (Signed)
Advanced care plan.  Purpose of the Encounter: CODE STATUS  Parties in Attendance: Patient and interpreter  Patient's Decision Capacity: Okay  Subjective/Patient's story: Presented to emergency room for shortness of breath and low oxygen saturation   Objective/Medical story Patient has stage IV pancreatic cancer with lung metastasis presented to emergency room for shortness of breath has pulmonary edema and hypoxia and pneumonia Needs IV antibiotics and Lasix for diuresis   Goals of care determination:  Advance care directives goals of care and treatment plan discussed with the patient with the help of interpreter Patient does not want any CPR, intubation ventilator if the need arises   CODE STATUS: DNR   Time spent discussing advanced care planning: 16 minutes

## 2018-09-30 NOTE — ACP (Advance Care Planning) (Addendum)
Ch. attempted to finalize adv directive today as requested; but friend of pt declined the option to have it signed at the hospital. Encourage palliative care team to f/u with pt.    Update: was able to finalize adv directive w/ family present this evening.

## 2018-09-30 NOTE — Progress Notes (Signed)
58 y.o. 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 He has been off bipap since yesterday and is doing well on [place] 2 to 4 lo2 without resp distress. He is DNR and awaits floor bed. Palliative care team has been following him

## 2018-10-01 LAB — BASIC METABOLIC PANEL
Anion gap: 8 (ref 5–15)
BUN: 15 mg/dL (ref 6–20)
CO2: 40 mmol/L — ABNORMAL HIGH (ref 22–32)
Calcium: 8.6 mg/dL — ABNORMAL LOW (ref 8.9–10.3)
Chloride: 85 mmol/L — ABNORMAL LOW (ref 98–111)
Creatinine, Ser: 0.69 mg/dL (ref 0.61–1.24)
GFR calc Af Amer: >60 mL/min (ref >60)
GFR calc non Af Amer: >60 mL/min (ref >60)
Glucose, Bld: 125 mg/dL — ABNORMAL HIGH (ref 70–99)
POTASSIUM: 3.7 mmol/L (ref 3.5–5.1)
Sodium: 133 mmol/L — ABNORMAL LOW (ref 135–145)

## 2018-10-01 NOTE — Progress Notes (Addendum)
[facility] at [place]: Eugene Burton    MR#:  [id number]  DATE OF BIRTH:  [birth year]  SUBJECTIVE:  CHIEF COMPLAINT:   Chief Complaint  Patient presents with  . Shortness of Breath  Patient seen and evaluated today On oxygen via nasal cannula Has decreased shortness of breath Cough occasionally Moved to medical floor today Communicated with the help of interpretor  REVIEW OF SYSTEMS:    ROS  CONSTITUTIONAL: No documented fever. Has fatigue, weakness. No weight gain, no weight loss.  EYES: No blurry or double vision.  ENT: No tinnitus. No postnasal drip. No redness of the oropharynx.  RESPIRATORY: occasional cough, no wheeze, no hemoptysis. Has dyspnea.  CARDIOVASCULAR: No chest pain. No orthopnea. No palpitations. No syncope.  GASTROINTESTINAL: No nausea, no vomiting or diarrhea. No abdominal pain. No melena or hematochezia.  GENITOURINARY: No dysuria or hematuria.  ENDOCRINE: No polyuria or nocturia. No heat or cold intolerance.  HEMATOLOGY: No anemia. No bruising. No bleeding.  INTEGUMENTARY: No rashes. No lesions.  MUSCULOSKELETAL: No arthritis. No swelling. No gout.  NEUROLOGIC: No numbness, tingling, or ataxia. No seizure-type activity.  PSYCHIATRIC: No anxiety. No insomnia. No ADD.   DRUG ALLERGIES:  No Known Allergies  VITALS:  Blood pressure (!) 146/92, pulse 87, temperature 98.4 F (36.9 C), temperature source Oral, resp. rate 17, height 5' 2.99" (1.6 m), weight 45.4 kg, SpO2 100 %.  PHYSICAL EXAMINATION:   Physical Exam  GENERAL:  58 y.o.-year-old patient lying in the bed with no acute distress.  EYES: Pupils equal, round, reactive to light and accommodation. No scleral icterus. Extraocular muscles intact.  HEENT: Head atraumatic, normocephalic. Oropharynx and nasopharynx clear.  NECK:  Supple, no jugular venous distention. No thyroid enlargement, no tenderness.  LUNGS: Improved breath sounds bilaterally,. No  use of accessory muscles of respiration.  Scattered Rales heard in both lungs CARDIOVASCULAR: S1, S2 normal. No murmurs, rubs, or gallops.  ABDOMEN: Soft, nontender, nondistended. Bowel sounds present. No organomegaly or mass.  EXTREMITIES: No cyanosis, clubbing or edema b/l.    NEUROLOGIC: Cranial nerves II through XII are intact. No focal Motor or sensory deficits b/l.   PSYCHIATRIC: The patient is alert and oriented x 3.  SKIN: No obvious rash, lesion, or ulcer.   LABORATORY PANEL:   CBC Recent Labs  Lab 09/30/18 0404  WBC 9.5  HGB 8.2*  HCT 25.1*  PLT 168   ------------------------------------------------------------------------------------------------------------------ Chemistries  Recent Labs  Lab 09/30/18 0404 10/01/18 0453  NA 131* 133*  K 3.0* 3.7  CL 87* 85*  CO2 35* 40*  GLUCOSE 145* 125*  BUN 17 15  CREATININE 0.74 0.69  CALCIUM 8.3* 8.6*  MG 2.3  --    ------------------------------------------------------------------------------------------------------------------  Cardiac Enzymes Recent Labs  Lab 09/28/18 1251  TROPONINI <0.03   ------------------------------------------------------------------------------------------------------------------  RADIOLOGY:  No results found.   ASSESSMENT AND PLAN:  58 year old male patient with history of stage IV pancreatic cancer, recently diagnosed metastatic lung cancer, deafness, numbness, hypertension currently under hospitalist service for shortness of breath  -Acute on chronic respiratory failure with hypoxia Secondary to metastatic cancer and pneumonia Weaned off BiPAP On oxygen via nasal cannula currently Nebulization treatments to continue Antitussive medication Oncology follow-up  -Acute pulmonary edema improved Monitor electrolytes  -Pneumonia improving Currently on IV cefepime antibiotic Follow-up cultures MRSA PCR is negative  -Metastatic cancer Patient is DNR by [place] care follow-up Social worker follow-up Disposition Home with hospice versus hospice facility  All the records are reviewed and case discussed with Care Management/Social Worker. Management plans discussed with the patient, family and they are in agreement.  CODE STATUS: DNR  DVT Prophylaxis: SCDs  TOTAL TIME TAKING CARE OF THIS PATIENT: 36 minutes.   POSSIBLE D/C IN 2 to 3 DAYS, DEPENDING ON CLINICAL CONDITION.  Saundra Shelling M.D on 10/01/2018 at 12:20 PM  Between 7am to 6pm - Pager - [phone]  After 6pm go to [email or web address] - password [email or web address] [facility]  Office  [phone]  CC: Primary care physician; Patient, No Pcp Per  Note: This dictation was prepared with Dragon dictation along with smaller phrase technology. Any transcriptional errors that result from this process are unintentional.

## 2018-10-02 LAB — BASIC METABOLIC PANEL
Anion gap: 10 (ref 5–15)
BUN: 20 mg/dL (ref 6–20)
CO2: 38 mmol/L — ABNORMAL HIGH (ref 22–32)
Calcium: 8.6 mg/dL — ABNORMAL LOW (ref 8.9–10.3)
Chloride: 83 mmol/L — ABNORMAL LOW (ref 98–111)
Creatinine, Ser: 0.82 mg/dL (ref 0.61–1.24)
GFR calc Af Amer: >60 mL/min (ref >60)
GFR calc non Af Amer: >60 mL/min (ref >60)
Glucose, Bld: 118 mg/dL — ABNORMAL HIGH (ref 70–99)
Potassium: 3.1 mmol/L — ABNORMAL LOW (ref 3.5–5.1)
Sodium: 131 mmol/L — ABNORMAL LOW (ref 135–145)

## 2018-10-02 MED ORDER — FUROSEMIDE 10 MG/ML IJ SOLN
40.0000 mg | Freq: Two times a day (BID) | INTRAMUSCULAR | Status: DC
  Administered 2018-10-02 – 2018-10-05 (×6): 40 mg via INTRAVENOUS
  Filled 2018-10-02 (×7): qty 4

## 2018-10-02 MED ORDER — POTASSIUM CHLORIDE CRYS ER 20 MEQ PO TBCR
40.0000 meq | EXTENDED_RELEASE_TABLET | Freq: Once | ORAL | Status: AC
  Administered 2018-10-02: 09:00:00 40 meq via ORAL
  Filled 2018-10-02: qty 2

## 2018-10-02 MED ORDER — LEVOFLOXACIN 500 MG PO TABS
500.0000 mg | ORAL_TABLET | Freq: Every day | ORAL | Status: AC
  Administered 2018-10-02 – 2018-10-04 (×3): 500 mg via ORAL
  Filled 2018-10-02 (×3): qty 1

## 2018-10-02 MED ORDER — ALBUTEROL SULFATE (2.5 MG/3ML) 0.083% IN NEBU
2.5000 mg | INHALATION_SOLUTION | Freq: Three times a day (TID) | RESPIRATORY_TRACT | Status: DC
  Administered 2018-10-02 – 2018-10-05 (×9): 2.5 mg via RESPIRATORY_TRACT
  Filled 2018-10-02 (×9): qty 3

## 2018-10-02 MED ORDER — CHLORHEXIDINE GLUCONATE 0.12 % MT SOLN
15.0000 mL | Freq: Two times a day (BID) | OROMUCOSAL | Status: DC
  Administered 2018-10-04: 23:00:00 15 mL via OROMUCOSAL
  Filled 2018-10-02 (×2): qty 15

## 2018-10-02 MED ORDER — ORAL CARE MOUTH RINSE
15.0000 mL | Freq: Two times a day (BID) | OROMUCOSAL | Status: DC
  Administered 2018-10-02 (×2): 15 mL via OROMUCOSAL

## 2018-10-02 NOTE — Progress Notes (Signed)
[facility] at [place]: Eugene Burton    MR#:  [id number]  DATE OF BIRTH:  1961-02-07  SUBJECTIVE:  CHIEF COMPLAINT:   Chief Complaint  Patient presents with  . Shortness of Breath  Patient seen and evaluated today On oxygen via nasal cannula Has cough on and off No fever Has decreased shortness of breath Communicated with the help of interpretor Deaf and dumb  REVIEW OF SYSTEMS:    ROS  CONSTITUTIONAL: No documented fever. Has fatigue, weakness. No weight gain, no weight loss.  EYES: No blurry or double vision.  ENT: No tinnitus. No postnasal drip. No redness of the oropharynx.  RESPIRATORY: occasional cough, no wheeze, no hemoptysis. Has decreased dyspnea.  CARDIOVASCULAR: No chest pain. No orthopnea. No palpitations. No syncope.  GASTROINTESTINAL: No nausea, no vomiting or diarrhea. No abdominal pain. No melena or hematochezia.  GENITOURINARY: No dysuria or hematuria.  ENDOCRINE: No polyuria or nocturia. No heat or cold intolerance.  HEMATOLOGY: No anemia. No bruising. No bleeding.  INTEGUMENTARY: No rashes. No lesions.  MUSCULOSKELETAL: No arthritis. No swelling. No gout.  NEUROLOGIC: No numbness, tingling, or ataxia. No seizure-type activity.  PSYCHIATRIC: No anxiety. No insomnia. No ADD.   DRUG ALLERGIES:  No Known Allergies  VITALS:  Blood pressure 110/65, pulse (!) 111, temperature 97.7 F (36.5 C), temperature source Oral, resp. rate 18, height 5' 2.99" (1.6 m), weight 45.2 kg, SpO2 99 %.  PHYSICAL EXAMINATION:   Physical Exam  GENERAL:  58 y.o.-year-old patient lying in the bed with no acute distress.  EYES: Pupils equal, round, reactive to light and accommodation. No scleral icterus. Extraocular muscles intact.  HEENT: Head atraumatic, normocephalic. Oropharynx and nasopharynx clear.  NECK:  Supple, no jugular venous distention. No thyroid enlargement, no tenderness.  LUNGS: Improved breath sounds  bilaterally,. No use of accessory muscles of respiration.  Scattered Rales heard in both lungs CARDIOVASCULAR: S1, S2 normal. No murmurs, rubs, or gallops.  ABDOMEN: Soft, nontender, nondistended. Bowel sounds present. No organomegaly or mass.  EXTREMITIES: No cyanosis, clubbing or edema b/l.    NEUROLOGIC: Cranial nerves II through XII are intact. No focal Motor or sensory deficits b/l.   PSYCHIATRIC: The patient is alert and oriented x 3.  SKIN: No obvious rash, lesion, or ulcer.   LABORATORY PANEL:   CBC Recent Labs  Lab 09/30/18 0404  WBC 9.5  HGB 8.2*  HCT 25.1*  PLT 168   ------------------------------------------------------------------------------------------------------------------ Chemistries  Recent Labs  Lab 09/30/18 0404  10/02/18 0348  NA 131*   < > 131*  K 3.0*   < > 3.1*  CL 87*   < > 83*  CO2 35*   < > 38*  GLUCOSE 145*   < > 118*  BUN 17   < > 20  CREATININE 0.74   < > 0.82  CALCIUM 8.3*   < > 8.6*  MG 2.3  --   --    < > = values in this interval not displayed.   ------------------------------------------------------------------------------------------------------------------  Cardiac Enzymes Recent Labs  Lab 09/28/18 1251  TROPONINI <0.03   ------------------------------------------------------------------------------------------------------------------  RADIOLOGY:  No results found.   ASSESSMENT AND PLAN:  59 year old male patient with history of stage IV pancreatic cancer, recently diagnosed metastatic lung cancer, deafness, numbness, hypertension currently under hospitalist service for shortness of breath  -Acute on chronic respiratory failure with hypoxia Secondary to metastatic cancer and pneumonia Weaned off BiPAP On oxygen via nasal cannula currently at [employer]  Nebulization treatments to continue Antitussive medication Oncology follow-up Wean to oxygen via nasal canula to 2liter  -Acute hypokalemia Oral potassium  replacement  -Hyponatremia Monitor sodium levels Clinically from pneumonia  -Acute pulmonary edema improved Monitor electrolytes  -Pneumonia improving Discontinue IV cefepime antibiotic Start oral levaquin abx Cultures negative Deescalate abx MRSA PCR is negative  -Metastatic cancer Patient is DNR by [place] care follow-up Social worker follow-up Disposition Home with hospice versus [place] with hospice services once we get information from DSS regarding home situation Have to follow up with patients brother who is POA  All the records are reviewed and case discussed with Care Management/Social Worker. Management plans discussed with the patient, family and they are in agreement.  CODE STATUS: DNR  DVT Prophylaxis: SCDs  TOTAL TIME TAKING CARE OF THIS PATIENT: 34 minutes.   POSSIBLE D/C IN 1 to 2 DAYS, DEPENDING ON CLINICAL CONDITION.  Saundra Shelling M.D on 10/02/2018 at 11:09 AM  Between 7am to 6pm - Pager - [phone]  After 6pm go to [email or web address] - password [email or web address] [facility]  Office  [phone]  CC: Primary care physician; Patient, No Pcp Per  Note: This dictation was prepared with Dragon dictation along with smaller phrase technology. Any transcriptional errors that result from this process are unintentional.

## 2018-10-02 NOTE — Progress Notes (Signed)
Please note patient has a pending outpatient Palliative referral. CSW Annamaria Boots made aware.  Flo Shanks RN, BSN, [place] [facility], hospital Liaison [phone]

## 2018-10-02 NOTE — NC FL2 (Addendum)
[place] LEVEL OF CARE SCREENING TOOL     IDENTIFICATION  Patient Name: Eugene Burton Birthdate: 1960/12/10 Sex: male Admission Date (Current Location): 09/28/2018  [place] and [place] Number:  Engineering geologist and Address:  [facility], [address] [place], [place] [address]      Provider Number: [id number]  Attending Physician Name and Address:  Saundra Shelling, MD  Relative Name and Phone Number:       Current Level of Care: Hospital Recommended Level of Care: Independence Prior Approval Number:    Date Approved/Denied:   PASRR Number:    Discharge Plan: Domiciliary (Rest home)    Current Diagnoses: Patient Active Problem List   Diagnosis Date Noted  . Protein-calorie malnutrition, severe 09/29/2018  . Palliative care encounter   . Acute on chronic respiratory failure with hypoxia ([place]) 09/28/2018  . Adenocarcinoma of transverse colon ([place]) 09/27/2018  . Malnutrition of moderate degree 09/21/2018  . Metastatic cancer ([place]) 09/19/2018  . GERD (gastroesophageal reflux disease) 03/23/2016  . HTN (hypertension) 03/23/2016    Orientation RESPIRATION BLADDER Height & Weight     Self, Place, Time  O2 @ 2 liters  Continent Weight: 99 lb 10.4 oz (45.2 kg) Height:  5' 2.99" (160 cm)  BEHAVIORAL SYMPTOMS/MOOD NEUROLOGICAL BOWEL NUTRITION STATUS  (none) (none) Continent Diet: Regular   AMBULATORY STATUS COMMUNICATION OF NEEDS Skin   Limited Assist Non-Verbally(Sign language ) Normal                       Personal Care Assistance Level of Assistance  Bathing, Feeding, Dressing Bathing Assistance: Limited assistance Feeding assistance: Independent Dressing Assistance: Limited assistance     Functional Limitations Info  Sight, Hearing, Speech Sight Info: Adequate Hearing Info: Impaired(Deaf- uses sign language ) Speech Info: Impaired(Deaf- uses sign language )    SPECIAL CARE FACTORS FREQUENCY                       Contractures Contractures Info: Not present    Additional Factors Info  Code Status, Allergies Code Status Info: DNR Allergies Info: NKA             Discharge Medications: Medication List    STOP taking these medications   albuterol 108 (90 Base) MCG/ACT inhaler Commonly known as:  PROVENTIL HFA;VENTOLIN HFA Replaced by:  albuterol (2.5 MG/3ML) 0.083% nebulizer solution   losartan 50 MG tablet Commonly known as:  COZAAR   losartan-hydrochlorothiazide 50-12.5 MG tablet Commonly known as:  HYZAAR     TAKE these medications   albuterol (2.5 MG/3ML) 0.083% nebulizer solution Commonly known as:  PROVENTIL Take 3 mLs (2.5 mg total) by nebulization every 2 (two) hours as needed for wheezing or shortness of breath. Replaces:  albuterol 108 (90 Base) MCG/ACT inhaler   guaiFENesin-codeine 100-10 MG/5ML syrup Take 10 mLs by mouth every 6 (six) hours as needed for cough.   hydrochlorothiazide 12.5 MG tablet Commonly known as:  HYDRODIURIL Take 1 tablet (12.5 mg total) by mouth daily.   HYDROcodone-acetaminophen 5-325 MG tablet Commonly known as:  NORCO/VICODIN Take 1 tablet by mouth every 6 (six) hours as needed for moderate pain.   morphine 20 MG/ML concentrated solution Commonly known as:  ROXANOL Take 0.5 mLs (10 mg total) by mouth every 2 (two) hours as needed for severe pain or anxiety.   nebivolol 5 MG tablet Commonly known as:  BYSTOLIC Take 1 tablet (5 mg  total) by mouth daily.   ondansetron 4 MG tablet Commonly known as:  ZOFRAN Take 1 tablet (4 mg total) by mouth every 6 (six) hours as needed for nausea.   ranitidine 150 MG tablet Commonly known as:  ZANTAC Take 1 tablet (150 mg total) by mouth 2 (two) times daily.     Relevant Imaging Results:  Relevant Lab Results:   Additional Information    Tyona Nilsen  Louretta Shorten, LCSWA

## 2018-10-02 NOTE — Progress Notes (Signed)
F/u with pt with the assistance of the ASD stick to provide a prayer shawl. Pt seemed to be in an upbeat mood and was thankful for the shawl. No f/u needed since he is transitioning to hospice care.    10/02/18 0905  Clinical Encounter Type  Visited With Patient  Visit Type Follow-up;Spiritual support;Social support  Referral From Chaplain  Consult/Referral To Chaplain;None  Spiritual Encounters  Spiritual Needs Grief support;Other (Comment) (provided a prayer shawl )  Stress Factors  Patient Stress Factors Loss;Major life changes  Family Stress Factors Loss;Major life changes

## 2018-10-02 NOTE — Evaluation (Signed)
Physical Therapy Evaluation Patient Details Name: Eugene Burton MRN: [id number] DOB: 17-Feb-1961 Today's Date: 10/02/2018   History of Present Illness  Pt is a 58 y/o M with a known history of recently diagnosed lung metastasis and pancreatic cancer stage IV.  The patient was discharged recently after biopsy in the hospital.  He was placed on oxygen by nasal cannula at home due to hypoxia.  He started to experience worsening shortness of breath which is reason for his current admission.  He was found in respiratory distress and put on BiPAP.  Chest x-ray show pulmonary edema or pneumonia or lung metastasis.  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     Clinical Impression  Pt admitted with above diagnosis. Pt currently with functional limitations due to the deficits listed below (see PT Problem List).  Pt is a poor historian when providing information on PLOF and amount of assist required PTA.  It sounds like he was ind with ambulation without AD and was bathing, dressing independently.  He reports having some assist from his brother with the cooking and light cleaning.  Pt is still driving.  Per chart review, however, it sounds like there were concerns regarding self-care PTA and APS has been involved.  In regards to mobility, pt ambulated 160 ft without AD and no signs of instability this session, but does demonstrate decreased gait speed with slightly guarded posture. He requires 1 standing rest break due to fatigue.  SpO2 remained at or above 94% on 4L O2 throughout session. Given pt's current mobility status, recommend HHPT at d/c whether this be ALF or to home.  Pt will benefit from skilled PT to increase their independence and safety with mobility to allow discharge to the venue listed below.      Follow Up Recommendations Home health PT;Supervision - Intermittent    Equipment Recommendations  None recommended by PT    Recommendations for Other Services       Precautions /  Restrictions Precautions Precautions: Fall;Other (comment) Precaution Comments: O2 (2L at baseline) Restrictions Weight Bearing Restrictions: No      Mobility  Bed Mobility Overal bed mobility: Independent             General bed mobility comments: No physical assist or cues needed.  Pt performs independently.   Transfers Overall transfer level: Needs assistance Equipment used: None Transfers: Sit to/from Stand Sit to Stand: Supervision         General transfer comment: Supervision for safety.  No unsteadiness noted.    Ambulation/Gait Ambulation/Gait assistance: Min guard Gait Distance (Feet): 160 Feet Assistive device: (pushing interpreter iPad pole) Gait Pattern/deviations: Decreased step length - right;Decreased step length - left Gait velocity: decreased   General Gait Details: Slightly decreased gait speed and pt required 1 standing rest break holding onto railing in hallway due to fatigue.  Pt steady pushing interpreter pole.  Min guard for safety.   Stairs            Wheelchair Mobility    Modified Rankin (Stroke Patients Only)       Balance Overall balance assessment: Needs assistance Sitting-balance support: No upper extremity supported;Feet supported Sitting balance-Leahy Scale: Good     Standing balance support: No upper extremity supported;During functional activity Standing balance-Leahy Scale: Good Standing balance comment: Pt steady in static standing without UE support and ambulates in room without UE support with no signs of instability.  Pertinent Vitals/Pain Pain Assessment: Faces Pain Score: (Pt says, "so so") Faces Pain Scale: Hurts little more Pain Location: stomach Pain Descriptors / Indicators: Discomfort;Grimacing Pain Intervention(s): Monitored during session;Limited activity within patient's tolerance;Repositioned    Home Living Family/patient expects to be discharged to::  Assisted living Living Arrangements: Alone Available Help at Discharge: Family;Available PRN/intermittently Type of Home: House         Home Equipment: None      Prior Function Level of Independence: Needs assistance   Gait / Transfers Assistance Needed: Pt ambulating household distances without AD.    ADL's / Homemaking Assistance Needed: Pt reports he was independent with bathing, dressing.  Does some cooking, family assist with some cooking as well.  Pt still driving.   Comments: Pt is a poor historian and does not expand on open ended questions, but sometimes replies with just "yes" or "no".      Hand Dominance        Extremity/Trunk Assessment   Upper Extremity Assessment Upper Extremity Assessment: (BUE strength grossly 4/5)    Lower Extremity Assessment Lower Extremity Assessment: (BLE strength grossly 4/5)       Communication   Communication: Deaf(utilized interpreter using iPad, interpreter Anderson Malta)  Cognition Arousal/Alertness: Awake/alert Behavior During Therapy: WFL for tasks assessed/performed Overall Cognitive Status: Difficult to assess                                 General Comments: Pt at times answering with "yes" or "no" when interpreter asking open ended questions.  Does not always answer questions in full but give shortened responses.       General Comments General comments (skin integrity, edema, etc.): SpO2 remained at or above 94% on 4L O2 throughout session    Exercises Other Exercises Other Exercises: Encouraged pt to ambulate with nursing staff at least 3x/day   Assessment/Plan    PT Assessment Patient needs continued PT services  PT Problem List Decreased strength;Decreased activity tolerance;Decreased balance;Cardiopulmonary status limiting activity       PT Treatment Interventions DME instruction;Gait training;Stair training;Functional mobility training;Therapeutic activities;Therapeutic exercise;Balance  training;Neuromuscular re-education;Patient/family education    PT Goals (Current goals can be found in the Care Plan section)  Acute Rehab PT Goals Patient Stated Goal: to feel better and to spend more time with his friends PT Goal Formulation: With patient Time For Goal Achievement: 10/16/18 Potential to Achieve Goals: Good    Frequency Min 2X/week   Barriers to discharge        Co-evaluation               AM-PAC PT "6 Clicks" Mobility  Outcome Measure Help needed turning from your back to your side while in a flat bed without using bedrails?: None Help needed moving from lying on your back to sitting on the side of a flat bed without using bedrails?: None Help needed moving to and from a bed to a chair (including a wheelchair)?: A Little Help needed standing up from a chair using your arms (e.g., wheelchair or bedside chair)?: A Little Help needed to walk in hospital room?: A Little Help needed climbing 3-5 steps with a railing? : A Little 6 Click Score: 20    End of Session Equipment Utilized During Treatment: Gait belt;Oxygen Activity Tolerance: Patient tolerated treatment well;Patient limited by fatigue Patient left: in chair;with call bell/phone within reach;with chair alarm set Nurse Communication: Mobility status PT Visit Diagnosis:  Unsteadiness on feet (R26.81);Muscle weakness (generalized) (M62.81)    Time: 2712-9290 PT Time Calculation (min) (ACUTE ONLY): 37 min   Charges:   PT Evaluation $PT Eval Low Complexity: 1 Low PT Treatments $Gait Training: 8-22 mins        Collie Siad PT, DPT 10/02/2018, 12:58 PM

## 2018-10-02 NOTE — Progress Notes (Signed)
[place]  [phone] [phone]   Name: Eugene Burton Date: 10/02/2018 MRN: [id number]  DOB: 05-Apr-1961  Patient Care Team: Patient, No Pcp Per as PCP - General (General Practice) Clent Jacks, RN as Registered Nurse    REASON FOR CONSULTATION: Palliative Care consult requested for this57 y.o.malewith multiple medical problems including stage IV colon cancer, chronic respiratory failure requiring O2, deafness, malnutrition, GERD, and hypertension.Patient was recently hospitalized 09/18/2018 to 09/22/2018 with cough and dehydration and was found to have multiple lung masses bilaterally on CT scan. Abdominal CT revealed pancreatic lesions, liver lesions, a mass in the left kidney, and peritoneal carcinomatosis. He underwent biopsy of the omental lesion with pathology showing metastatic adenocarcinoma likely of colorectal origin. However, it is possible that patient has multiple primary cancers. Patient is now admitted 09/28/17 with acute on chronic respiratory failure. CXR reveals extensive bilateral opacities possible due to lymphangitic spread of tumor vs multifocal PNA vs pulmonary edema. Patient initially required BIPAP. He was referred to palliative care to help address goals.   CODE STATUS: DNR  PAST MEDICAL HISTORY: Past Medical History:  Diagnosis Date  . COPD (chronic obstructive pulmonary disease) ([place])   . Deaf    patient needs interpreter   . Dyspnea   . GERD (gastroesophageal reflux disease)   . Hypertension   . Kidney mass   . Pancreatic mass     PAST SURGICAL HISTORY:  Past Surgical History:  Procedure Laterality Date  . broken leg    . 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  MEDICATIONS:  Current Facility-Administered Medications  Medication Dose Route Frequency Provider Last Rate Last Dose  . 0.9 %  sodium chloride infusion  250 mL Intravenous PRN Demetrios Loll, MD 10 mL/hr at 10/02/18 0639 20 mL at 10/02/18 0639  . acetaminophen (TYLENOL) tablet 650 mg  650 mg Oral Q6H PRN Demetrios Loll, MD       Or  . acetaminophen (TYLENOL) suppository 650 mg  650 mg Rectal Q6H PRN Demetrios Loll, MD      . albuterol (PROVENTIL) (2.5 MG/3ML) 0.083% nebulizer solution 2.5 mg  2.5 mg Nebulization Q2H PRN Demetrios Loll, MD      . albuterol (PROVENTIL) (2.5 MG/3ML) 0.083% nebulizer solution 2.5 mg  2.5 mg Nebulization Q6H Demetrios Loll, MD   2.5 mg at 10/02/18 1310  . bisacodyl (DULCOLAX) EC tablet 5 mg  5 mg Oral Daily PRN Demetrios Loll, MD      . chlorhexidine (PERIDEX) 0.12 % solution 15 mL  15 mL Mouth Rinse BID Pyreddy, Pavan, MD      . chlorpheniramine-HYDROcodone (TUSSIONEX) 10-8 MG/5ML suspension 5 mL  5 mL Oral Q12H PRN Awilda Bill, NP   5 mL at 09/30/18 2107  . famotidine (PEPCID) tablet 20 mg  20 mg Oral Daily Demetrios Loll, MD   20 mg at 10/02/18 0907  . feeding supplement (ENSURE ENLIVE) (ENSURE ENLIVE) liquid 237 mL  237 mL Oral TID BM Rosine Door, MD  237 mL at 10/02/18 0907  . furosemide (LASIX) injection 40 mg  40 mg Intravenous Q12H Saundra Shelling, MD   40 mg at 10/02/18 0636  . guaiFENesin-dextromethorphan (ROBITUSSIN DM) 100-10 MG/5ML syrup 5 mL  5 mL Oral Q4H PRN Rosine Door, MD   5 mL at 10/01/18 0755  . heparin injection 5,000 Units  5,000 Units Subcutaneous Q8H Demetrios Loll, MD   5,000 Units at 10/02/18 1302  . hydrALAZINE (APRESOLINE) injection 10 mg  10 mg Intravenous Q6H PRN Demetrios Loll, MD      . HYDROcodone-acetaminophen (NORCO/VICODIN) 5-325  MG per tablet 1-2 tablet  1-2 tablet Oral Q4H PRN Demetrios Loll, MD   1 tablet at 10/02/18 1301  . levofloxacin (LEVAQUIN) tablet 500 mg  500 mg Oral Daily Pyreddy, Pavan, MD   500 mg at 10/02/18 1249  . losartan (COZAAR) tablet 50 mg  50 mg Oral Daily Demetrios Loll, MD   50 mg at 10/02/18 0907  . MEDLINE mouth rinse  15 mL Mouth Rinse q12n4p Pyreddy, Pavan, MD   15 mL at 10/02/18 1249  . morphine 2 MG/ML injection 1-2 mg  1-2 mg Intravenous Q4H PRN Awilda Bill, NP   2 mg at 09/30/18 0454  . morphine 4 MG/ML injection 4 mg  4 mg Intravenous Q4H PRN Demetrios Loll, MD      . multivitamin with minerals tablet 1 tablet  1 tablet Oral Daily Rosine Door, MD   1 tablet at 10/02/18 7672  . nebivolol (BYSTOLIC) tablet 5 mg  5 mg Oral Daily Demetrios Loll, MD   5 mg at 10/02/18 0907  . ondansetron (ZOFRAN) tablet 4 mg  4 mg Oral Q6H PRN Demetrios Loll, MD       Or  . ondansetron [facility]) injection 4 mg  4 mg Intravenous Q6H PRN Demetrios Loll, MD      . senna-docusate (Senokot-S) tablet 1 tablet  1 tablet Oral QHS PRN Demetrios Loll, MD      . sodium chloride flush (NS) 0.9 % injection 3 mL  3 mL Intravenous Q12H Demetrios Loll, MD   3 mL at 10/02/18 0900  . sodium chloride flush (NS) 0.9 % injection 3 mL  3 mL Intravenous PRN Demetrios Loll, MD        VITAL SIGNS: BP 110/65 (BP Location: Right Arm)   Pulse (!) 111   Temp 97.7 F (36.5 C) (Oral)   Resp 18   Ht 5' 2.99" (1.6 m)   Wt 99 lb 10.4 oz (45.2 kg)   SpO2 96%   BMI 17.66 kg/m  Filed Weights   09/30/18 0102 10/01/18 0522 10/02/18 0406  Weight: 106 lb 4.2 oz (48.2 kg) 100 lb 1.4 oz (45.4 kg) 99 lb 10.4 oz (45.2 kg)    Estimated body mass index is 17.66 kg/m as calculated from the following:   Height as of this encounter: 5' 2.99" (1.6 m).   Weight as of this encounter: 99 lb 10.4 oz (45.2 kg).  LABS: CBC:    Component Value Date/Time   WBC 9.5 09/30/2018 0404   HGB 8.2 (L) 09/30/2018 0404   HGB 12.0 (L) 06/01/2018 1940   HCT 25.1 (L) 09/30/2018 0404    HCT 36.7 (L) 06/01/2018 1940   PLT 168 09/30/2018 0404   PLT 382 06/01/2018 1940   MCV 83.7 09/30/2018 0404   MCV 88 06/01/2018 1940   NEUTROABS 7.8 (H) 09/30/2018 0404   NEUTROABS 5.5 06/01/2018 1940   LYMPHSABS 0.6 (L) 09/30/2018 0404  LYMPHSABS 1.6 06/01/2018 1940   MONOABS 0.9 09/30/2018 0404   EOSABS 0.1 09/30/2018 0404   EOSABS 0.1 06/01/2018 1940   BASOSABS 0.0 09/30/2018 0404   BASOSABS 0.0 06/01/2018 1940   Comprehensive Metabolic Panel:    Component Value Date/Time   NA 131 (L) 10/02/2018 0348   NA 141 06/01/2018 1940   K 3.1 (L) 10/02/2018 0348   CL 83 (L) 10/02/2018 0348   CO2 38 (H) 10/02/2018 0348   BUN 20 10/02/2018 0348   BUN 11 06/01/2018 1940   CREATININE 0.82 10/02/2018 0348   GLUCOSE 118 (H) 10/02/2018 0348   CALCIUM 8.6 (L) 10/02/2018 0348   AST 24 09/18/2018 1806   ALT 17 09/18/2018 1806   ALKPHOS 279 (H) 09/18/2018 1806   BILITOT 0.6 09/18/2018 1806   BILITOT 0.3 06/01/2018 1940   PROT 6.3 (L) 09/18/2018 1806   PROT 6.5 06/01/2018 1940   ALBUMIN 3.0 (L) 09/18/2018 1806   ALBUMIN 4.1 06/01/2018 1940    RADIOGRAPHIC STUDIES: Dg Chest 2 View  Result Date: 09/18/2018 CLINICAL DATA:  Cough. Weight loss, patient has been feeling poorly for 3 months. EXAM: CHEST - 2 VIEW COMPARISON:  Remote chest radiograph 04/07/2008 FINDINGS: Heart is normal in size. Diffuse peribronchial and interstitial thickening. Slight bilateral hilar prominence. No confluent airspace disease or radiographic findings of mass. Minimal fluid in the fissures without subpulmonic effusion. No acute osseous abnormalities are seen. IMPRESSION: 1. Diffuse peribronchial interstitial thickening with diagnostic considerations of pulmonary edema or bronchitis. 2. Mild bilateral hilar prominence is nonspecific, may be vascular overlap versus adenopathy. Consider further evaluation with chest CT with contrast for further evaluation. 3. No radiographic findings of pneumonia or pulmonary mass.  Electronically Signed   By: Keith Rake M.D.   On: 09/18/2018 22:39   Ct Chest W Contrast  Result Date: 09/18/2018 EXAM: CT CHEST, ABDOMEN, AND PELVIS WITH CONTRAST TECHNIQUE: Multidetector CT imaging of the chest, abdomen and pelvis was performed following the standard protocol during bolus administration of intravenous contrast. CONTRAST:  19mL OMNIPAQUE IOHEXOL 300 MG/ML  SOLN COMPARISON:  Same day CXR FINDINGS: CT CHEST FINDINGS Cardiovascular: Conventional branch pattern of the great vessels. Nonaneurysmal thoracic aorta. No large central pulmonary embolus. Heart size is top normal without pericardial effusion or thickening. Mediastinum/Nodes: Mild thyromegaly with retroclavicular extension of the thyroid gland. No dominant mass. Midline patent trachea. Mainstem bronchi appear patent. Mild enlargement of mediastinal lymph nodes in the right upper paratracheal, prevascular, left lower paratracheal and bilateral hilar lymph node stations ranging in size from 10 mm through 12 mm. The CT appearance of the esophagus is unremarkable. Lungs/Pleura: Interstitial thickening likely representing interstitial edema with ill-defined patchy multilobar masslike bilateral airspace opacities and peribronchial thickening are identified bilaterally. The largest is noted in the superior segment of left lower lobe measuring approximately 2 x 2.4 x 1.8 cm with smaller ill-defined nodular opacities scattered throughout both lungs. No effusion or pneumothorax. Musculoskeletal: No chest wall mass or suspicious bone lesions identified. CT ABDOMEN PELVIS FINDINGS Hepatobiliary: Concerning ill-defined hypodense lesion in the posterior segment of right hepatic lobe measuring 4.1 x 3.7 x 4.7 cm in transverse by AP by craniocaudad suspicious for hepatic metastasis. No biliary dilatation. Nondistended gallbladder containing a 1.6 cm gallstone. Pancreas: Hypodense ill-defined mass measuring 4.8 x 4.5 x 4.4 cm centered within the body  of the pancreas concerning for pancreatic carcinoma. No ductal dilatation. Patent portal and splenic veins. Spleen: Normal Adrenals/Urinary Tract: Bilobed hypodense masslike abnormality in the left adrenal gland  likely representing metastatic disease measuring at least 3.8 x 1.6 cm, series 2/49. Exophytic complex cystic mass arising lower pole of the left kidney measuring 4.5 x 3.6 x 5 cm with mural nodule along the caudal aspect measuring up to 1.2 x 1.5 x 1.8 cm. Hypodense lesions of the right kidney are more likely to represent simple cysts though some are too small to further characterize. The largest is in the lower pole posteriorly measuring 1.8 cm in diameter. Stomach/Bowel: The stomach is nondistended. Normal small bowel rotation. Slight fluid-filled distention of distal and mid small bowel loops without mechanical obstruction. Normal appendix, terminal and distal ileum. Moderate transmural thickening of the transverse colon with ill-defined masslike abnormality involving the distal transverse colon measuring 5.2 x 7.4 x 2.9 cm, series 2/59 with single wall thickness up to 2.4 cm. There are adjacent peritoneal masses concerning for peritoneal carcinomatosis measuring 1.7 cm in the left hemiabdomen, series 2/60 with smaller mesenteric nodules in the left upper quadrant measuring up to 0.8 cm and 1.4 cm. Mesenteric edema and mesenteric nodules are also noted further caudad within the left hemiabdomen, the largest measuring up to 2 cm, series 2/68. Smaller left pelvic mesenteric nodule measuring 0.8 cm is seen on the left. Vascular/Lymphatic: Mild aortoiliac atherosclerosis without aneurysm or dissection. No retroperitoneal or definite mesenteric adenopathy. Reproductive: Prostate is unremarkable. Other: No free air or free fluid. Musculoskeletal: Suspicious osteolytic appearance of the right femoral head on axial image 107, series 2. A more circumscribed left femoral neck lesion measuring up to 1.5 cm with  ill-defined right posterior iliac bone 2.1 cm lesion. IMPRESSION: Chest CT: 1. Ill-defined masslike opacities scattered throughout both lungs with associated hilar and mediastinal lymphadenopathy concerning for metastatic disease. Given the masslike abnormalities noted within the abdomen involving the pancreas and transverse colon, suspect that these are more likely metastatic and not due to a primary pulmonary neoplasm. 2. Nonaneurysmal thoracic aorta. 3. No acute pulmonary embolus. CT AP: 1. Hypodense mass centered within the body of the pancreas measuring 4.8 x 4.5 x 4.4 cm concerning for pancreatic carcinoma. 2. Masslike abnormality of the distal transverse colon measuring 5.2 x 7.4 x 2.9 cm with single wall thickness up to 2.4 cm. No bowel obstruction. This is also concerning for colonic neoplasm. 3. Hypodense mass of the right hepatic lobe measuring 4.1 x 3.7 x 4.7 cm concerning for hepatic metastasis. 4. Cystic mass arising off the lower pole of the left kidney with solid nodular component measuring 4.5 x 3.6 x 5 cm with the mural nodule measuring 1.2 x 1.5 x 1.8 cm. Cystic renal cell carcinoma is not excluded. 5. Findings concerning for peritoneal carcinomatosis with mesenteric nodules as above described. No ascites however is identified. 6. 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 These results were called by telephone at the time of interpretation on 09/18/2018 at 11:58 pm to Dr. Mable Paris , who verbally acknowledged these results. Electronically Signed   By: Ashley Royalty M.D.   On: 09/18/2018 23:58   Ct Abdomen Pelvis W Contrast  Result Date: 09/18/2018 EXAM: CT CHEST, ABDOMEN, AND PELVIS WITH CONTRAST TECHNIQUE: Multidetector CT imaging of the chest, abdomen and pelvis was performed following the standard protocol during bolus administration of intravenous contrast. CONTRAST:  60mL OMNIPAQUE IOHEXOL 300 MG/ML  SOLN COMPARISON:  Same day CXR FINDINGS: CT CHEST FINDINGS  Cardiovascular: Conventional branch pattern of the great vessels. Nonaneurysmal thoracic aorta. No large central pulmonary embolus. Heart size is top normal without  pericardial effusion or thickening. Mediastinum/Nodes: Mild thyromegaly with retroclavicular extension of the thyroid gland. No dominant mass. Midline patent trachea. Mainstem bronchi appear patent. Mild enlargement of mediastinal lymph nodes in the right upper paratracheal, prevascular, left lower paratracheal and bilateral hilar lymph node stations ranging in size from 10 mm through 12 mm. The CT appearance of the esophagus is unremarkable. Lungs/Pleura: Interstitial thickening likely representing interstitial edema with ill-defined patchy multilobar masslike bilateral airspace opacities and peribronchial thickening are identified bilaterally. The largest is noted in the superior segment of left lower lobe measuring approximately 2 x 2.4 x 1.8 cm with smaller ill-defined nodular opacities scattered throughout both lungs. No effusion or pneumothorax. Musculoskeletal: No chest wall mass or suspicious bone lesions identified. CT ABDOMEN PELVIS FINDINGS Hepatobiliary: Concerning ill-defined hypodense lesion in the posterior segment of right hepatic lobe measuring 4.1 x 3.7 x 4.7 cm in transverse by AP by craniocaudad suspicious for hepatic metastasis. No biliary dilatation. Nondistended gallbladder containing a 1.6 cm gallstone. Pancreas: Hypodense ill-defined mass measuring 4.8 x 4.5 x 4.4 cm centered within the body of the pancreas concerning for pancreatic carcinoma. No ductal dilatation. Patent portal and splenic veins. Spleen: Normal Adrenals/Urinary Tract: Bilobed hypodense masslike abnormality in the left adrenal gland likely representing metastatic disease measuring at least 3.8 x 1.6 cm, series 2/49. Exophytic complex cystic mass arising lower pole of the left kidney measuring 4.5 x 3.6 x 5 cm with mural nodule along the caudal aspect measuring up  to 1.2 x 1.5 x 1.8 cm. Hypodense lesions of the right kidney are more likely to represent simple cysts though some are too small to further characterize. The largest is in the lower pole posteriorly measuring 1.8 cm in diameter. Stomach/Bowel: The stomach is nondistended. Normal small bowel rotation. Slight fluid-filled distention of distal and mid small bowel loops without mechanical obstruction. Normal appendix, terminal and distal ileum. Moderate transmural thickening of the transverse colon with ill-defined masslike abnormality involving the distal transverse colon measuring 5.2 x 7.4 x 2.9 cm, series 2/59 with single wall thickness up to 2.4 cm. There are adjacent peritoneal masses concerning for peritoneal carcinomatosis measuring 1.7 cm in the left hemiabdomen, series 2/60 with smaller mesenteric nodules in the left upper quadrant measuring up to 0.8 cm and 1.4 cm. Mesenteric edema and mesenteric nodules are also noted further caudad within the left hemiabdomen, the largest measuring up to 2 cm, series 2/68. Smaller left pelvic mesenteric nodule measuring 0.8 cm is seen on the left. Vascular/Lymphatic: Mild aortoiliac atherosclerosis without aneurysm or dissection. No retroperitoneal or definite mesenteric adenopathy. Reproductive: Prostate is unremarkable. Other: No free air or free fluid. Musculoskeletal: Suspicious osteolytic appearance of the right femoral head on axial image 107, series 2. A more circumscribed left femoral neck lesion measuring up to 1.5 cm with ill-defined right posterior iliac bone 2.1 cm lesion. IMPRESSION: Chest CT: 1. Ill-defined masslike opacities scattered throughout both lungs with associated hilar and mediastinal lymphadenopathy concerning for metastatic disease. Given the masslike abnormalities noted within the abdomen involving the pancreas and transverse colon, suspect that these are more likely metastatic and not due to a primary pulmonary neoplasm. 2. Nonaneurysmal thoracic  aorta. 3. No acute pulmonary embolus. CT AP: 1. Hypodense mass centered within the body of the pancreas measuring 4.8 x 4.5 x 4.4 cm concerning for pancreatic carcinoma. 2. Masslike abnormality of the distal transverse colon measuring 5.2 x 7.4 x 2.9 cm with single wall thickness up to 2.4 cm. No bowel obstruction. This is also  concerning for colonic neoplasm. 3. Hypodense mass of the right hepatic lobe measuring 4.1 x 3.7 x 4.7 cm concerning for hepatic metastasis. 4. Cystic mass arising off the lower pole of the left kidney with solid nodular component measuring 4.5 x 3.6 x 5 cm with the mural nodule measuring 1.2 x 1.5 x 1.8 cm. Cystic renal cell carcinoma is not excluded. 5. Findings concerning for peritoneal carcinomatosis with mesenteric nodules as above described. No ascites however is identified. 6. 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 These results were called by telephone at the time of interpretation on 09/18/2018 at 11:58 pm to Dr. Mable Paris , who verbally acknowledged these results. Electronically Signed   By: Ashley Royalty M.D.   On: 09/18/2018 23:58   Ct Biopsy  Result Date: 09/21/2018 CLINICAL DATA:  Pancreatic mass, peritoneal masses, liver mass, left renal mass, left adrenal mass and pulmonary masses. 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 Presenting for biopsy of one of the peritoneal masses. EXAM: CT GUIDED CORE BIOPSY OF PERITONEAL MASS ANESTHESIA/SEDATION: 2.0 mg IV Versed; 75 mcg IV Fentanyl Total Moderate Sedation Time:  42 minutes. The patient's level of consciousness and physiologic status were continuously monitored during the procedure by Radiology nursing. PROCEDURE: The procedure risks, benefits, and alternatives were explained to the patient. Questions regarding the procedure were encouraged and answered. The patient understands and consents to the procedure. A time-out was performed prior to initiating the procedure. The operative field was prepped  with chlorhexidine in a sterile fashion, and a sterile drape was applied covering the operative field. A sterile gown and sterile gloves were used for the procedure. Local anesthesia was provided with 1% Lidocaine. CT was performed in a supine position through the mid to lower abdomen. A site was chosen for biopsy along the left abdominal wall. Under CT guidance, a 17 gauge trocar needle was advanced. After confirming needle tip position, a total of 4 coaxial 18 gauge core biopsy samples were obtained and submitted in formalin. Additional CT was performed after biopsy. COMPLICATIONS: None FINDINGS: Peritoneal soft tissue nodule in the left mid lateral peritoneal cavity was chosen for biopsy and measures approximately 2 cm in maximal diameter. Solid tissue was obtained. There were no immediate complications. IMPRESSION: CT-guided core biopsy performed of peritoneal mass in the left mid lateral abdomen. Electronically Signed   By: Aletta Edouard M.D.   On: 09/21/2018 14:19   Dg Chest Port 1 View  Result Date: 09/29/2018 CLINICAL DATA:  Acute respiratory failure EXAM: PORTABLE CHEST 1 VIEW COMPARISON:  Chest radiograph dated 09/28/2018. CT chest dated 09/18/2018. FINDINGS: Increased interstitial markings in the lungs bilaterally. When correlating with recent CT, this appearance favors metastatic disease, although superimposed infection or interstitial edema is not excluded. No definite pleural effusions. No pneumothorax. The heart is normal in size. IMPRESSION: Increased interstitial markings in the lungs bilaterally, favoring metastatic disease when correlating with recent CT. Electronically Signed   By: Julian Hy M.D.   On: 09/29/2018 03:39   Dg Chest Port 1 View  Result Date: 09/28/2018 CLINICAL DATA:  Dyspnea.  History of COPD and lung carcinoma. EXAM: PORTABLE CHEST 1 VIEW COMPARISON:  CT chest, abdomen and pelvis and PA and lateral chest 09/18/2018. FINDINGS: Extensive bilateral interstitial and  alveolar opacities or predominantly perihilar and appear somewhat worse than on the comparison examination. Heart size is normal. No pneumothorax or pleural effusion. No acute bony abnormality. IMPRESSION: Extensive bilateral perihilar alveolar opacities could be due to pulmonary edema, multifocal  pneumonia or possibly lymphangitic spread of tumor. Electronically Signed   By: Inge Rise M.D.   On: 09/28/2018 13:19    PERFORMANCE STATUS (ECOG) : 3 - Symptomatic, >50% confined to bed  Review of Systems As noted above. Otherwise, a complete review of systems is negative.  Physical Exam General: NAD, frail appearing, thin  Pulmonary: clear ant fields, unlabored, on O2 Abdomen: soft, nontender, + bowel sounds Extremities: no edema Skin: no rashes Neurological: Weakness, nonvocal, alert  IMPRESSION: Weekend notes reviewed. Patient discharged out of the ICU. Appears to be stable without any acute changes. Patient has some pain currently and discussed pain regimen with RN.   Spoke with CM and SW about disposition. Apparently, brother is considering moving him into his house. Would recommend hospice involvement at home.   Case discussed with Dr. Rogue Bussing.  PLAN: Continue current scope of treatment Possible discharge home with hospice when medically ready   Time Total: 20 minutes  Visit consisted of counseling and education dealing with the complex and emotionally intense issues of symptom management and palliative care in the setting of serious and potentially life-threatening illness.Greater than 50%  of this time was spent counseling and coordinating care related to the above assessment and plan.  Signed by: Altha Harm, PhD, NP-C [phone] (Work Cell)

## 2018-10-02 NOTE — Clinical Social Work Note (Signed)
CSW spoke with Angela Nevin at [facility] regarding patient. Angela Nevin states that she is aware of patient and had spoken to someone at [facility] last week regarding potential placement. Angela Nevin states that she was supposed to receive information regarding patient last week but never did. CSW faxed referral information to [facility] for her to review for possible admission. CSW notified Angela Nevin that we are still waiting to hear back from [facility] regarding patient and to make a final decision. CSW also notified her that patient will need hospice services at facility. Angela Nevin states that patient can have hospice services at facility and that [facility]. CSW will continue to follow for discharge planning.   [place], [place]

## 2018-10-02 NOTE — Clinical Social Work Note (Signed)
CSW has left 2 voice mails for [place], DSS director, [phone] and [phone] regarding patient discharge plan. CSW waiting for call back. CSW will continue to follow for discharge planning.   [place], [place]

## 2018-10-03 LAB — CULTURE, BLOOD (ROUTINE X 2)
Culture: NO GROWTH
Culture: NO GROWTH
Special Requests: ADEQUATE

## 2018-10-03 LAB — BASIC METABOLIC PANEL
Anion gap: 11 (ref 5–15)
BUN: 23 mg/dL — ABNORMAL HIGH (ref 6–20)
CALCIUM: 8.7 mg/dL — AB (ref 8.9–10.3)
CO2: 35 mmol/L — AB (ref 22–32)
Chloride: 83 mmol/L — ABNORMAL LOW (ref 98–111)
Creatinine, Ser: 0.79 mg/dL (ref 0.61–1.24)
GFR calc Af Amer: >60 mL/min (ref >60)
GFR calc non Af Amer: >60 mL/min (ref >60)
Glucose, Bld: 111 mg/dL — ABNORMAL HIGH (ref 70–99)
Potassium: 3.6 mmol/L (ref 3.5–5.1)
Sodium: 129 mmol/L — ABNORMAL LOW (ref 135–145)

## 2018-10-03 LAB — CBC
HCT: 26 % — ABNORMAL LOW (ref 39.0–52.0)
Hemoglobin: 8.5 g/dL — ABNORMAL LOW (ref 13.0–17.0)
MCH: 28 pg (ref 26.0–34.0)
MCHC: 32.7 g/dL (ref 30.0–36.0)
MCV: 85.5 fL (ref 80.0–100.0)
Platelets: 182 10*3/uL (ref 150–400)
RBC: 3.04 MIL/uL — ABNORMAL LOW (ref 4.22–5.81)
RDW: 18.1 % — ABNORMAL HIGH (ref 11.5–15.5)
WBC: 14.4 10*3/uL — ABNORMAL HIGH (ref 4.0–10.5)
nRBC: 0 % (ref 0.0–0.2)

## 2018-10-03 NOTE — Progress Notes (Signed)
Physical Therapy Treatment Patient Details Name: NASIF BOS MRN: [id number] DOB: 02-22-1961 Today's Date: 10/03/2018    History of Present Illness Pt is a 58 y/o M with a known history of recently diagnosed lung metastasis and pancreatic cancer stage IV.  The patient was discharged recently after biopsy in the hospital.  He was placed on oxygen by nasal cannula at home due to hypoxia.  He started to experience worsening shortness of breath which is reason for his current admission.  He was found in respiratory distress and put on BiPAP.  Chest x-ray show pulmonary edema or pneumonia or lung metastasis.  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     PT Comments    Pt able to able 160 ft this session but was limited by dizziness that got up to a 3/10. Pt required 2 seated rest breaks in which he indicated the dizziness decreased. One episode of LOB occurred but pt was able to recover with the use of the wall hand rail and Min A. SpO2 did not drop below a 93% this session. Therapeutic exercises were attempted but ceased due to pt performing incorrectly, it appears that he was unable to understand despite interpreter. Further PT recommended for pt's activity tolerance and balance deficits.     Follow Up Recommendations  Home health PT;Supervision/Assistance - 24 hour     Equipment Recommendations  None recommended by PT    Recommendations for Other Services       Precautions / Restrictions Precautions Precautions: Fall Precaution Comments: O2 (2L at baseline) Restrictions Weight Bearing Restrictions: No    Mobility  Bed Mobility Overal bed mobility: Independent             General bed mobility comments: No physical assistance or VC needed.   Transfers Overall transfer level: Needs assistance Equipment used: None Transfers: Sit to/from Stand Sit to Stand: Min assist         General transfer comment:  Min guard for safety. Min guard for sit to stand and Min A  x1 for stand to sit.   Ambulation/Gait Ambulation/Gait assistance: Min assist Gait Distance (Feet): 160 Feet Assistive device: None Gait Pattern/deviations: Decreased step length - right;Decreased step length - left Gait velocity: decreased   General Gait Details: Pt ambulated on 2L of O2 throughout session. Pt able to ambulate 160 ft with intermittent rest breaks. Two sitting rest breaks required due to 3/10 dizziness with activity that decreased with rest to 2/10. One episode of LOB, pt able to recover with wall hand rail and Min A. Following LOB Min guard provided for saferty.    Stairs             Wheelchair Mobility    Modified Rankin (Stroke Patients Only)       Balance Overall balance assessment: Needs assistance Sitting-balance support: No upper extremity supported;Feet supported Sitting balance-Leahy Scale: Good     Standing balance support: No upper extremity supported;During functional activity Standing balance-Leahy Scale: Fair Standing balance comment: Pt steady in static standing but had one LOB while walking out of his room. Pt able to recover with the use of wall hand rail and Min A. Unsteadiness noted with gait.                            Cognition Arousal/Alertness: Awake/alert Behavior During Therapy: WFL for tasks assessed/performed Overall Cognitive Status: Difficult to assess  General Comments: Pt struggled to maintain focus and communicate despite intrepreter      Exercises Other Exercises Other Exercises: Attempted to do seated alternating marches and standing BUE support of counter single leg balance but it appears that pt is unable to understand despite interpreter.     General Comments General comments (skin integrity, edema, etc.): SpO2 remained at or above 93% throughout the session and HR did not get above 110.       Pertinent Vitals/Pain Pain Assessment: Faces Faces Pain  Scale: Hurts little more Pain Location: stomach Pain Descriptors / Indicators: Discomfort;Grimacing Pain Intervention(s): Monitored during session;Limited activity within patient's tolerance    Home Living                      Prior Function            PT Goals (current goals can now be found in the care plan section) Acute Rehab PT Goals Patient Stated Goal: to feel better and to spend more time with his friends PT Goal Formulation: With patient Time For Goal Achievement: 10/16/18 Potential to Achieve Goals: Good Progress towards PT goals: Not progressing toward goals - comment(Pt limited due to dizziness. )    Frequency    Min 2X/week      PT Plan Current plan remains appropriate;Other (comment)(updated to 24/7 assist this session )    Co-evaluation              AM-PAC PT "6 Clicks" Mobility   Outcome Measure  Help needed turning from your back to your side while in a flat bed without using bedrails?: None Help needed moving from lying on your back to sitting on the side of a flat bed without using bedrails?: None Help needed moving to and from a bed to a chair (including a wheelchair)?: A Little Help needed standing up from a chair using your arms (e.g., wheelchair or bedside chair)?: A Little Help needed to walk in hospital room?: A Little Help needed climbing 3-5 steps with a railing? : A Little 6 Click Score: 20    End of Session Equipment Utilized During Treatment: Gait belt;Oxygen Activity Tolerance: Other (comment)(Pt limited by dizziness. ) Patient left: in chair;with call bell/phone within reach;with chair alarm set Nurse Communication: Mobility status(SpO2, dizziness) PT Visit Diagnosis: Unsteadiness on feet (R26.81);Muscle weakness (generalized) (M62.81)     Time: 7026-3785 PT Time Calculation (min) (ACUTE ONLY): 31 min  Charges:  $Gait Training: 8-22 mins $Therapeutic Activity: 8-22 mins                    Dorothy Spark, SPT    10/03/2018, 5:00 PM

## 2018-10-03 NOTE — Progress Notes (Addendum)
[facility] [place] NAME: Zakariah Urwin    MR#:  [id number]  DATE OF BIRTH:  [birth year]  SUBJECTIVE:  Communicated with the help of interpretor Deaf and dumb Awaiting approval for placement versus home with hospice, discussed with case management/social services  REVIEW OF SYSTEMS:    ROS  CONSTITUTIONAL: No documented fever. Has fatigue, weakness. No weight gain, no weight loss.  EYES: No blurry or double vision.  ENT: No tinnitus. No postnasal drip. No redness of the oropharynx.  RESPIRATORY: occasional cough, no wheeze, no hemoptysis. Has decreased dyspnea.  CARDIOVASCULAR: No chest pain. No orthopnea. No palpitations. No syncope.  GASTROINTESTINAL: No nausea, no vomiting or diarrhea. No abdominal pain. No melena or hematochezia.  GENITOURINARY: No dysuria or hematuria.  ENDOCRINE: No polyuria or nocturia. No heat or cold intolerance.  HEMATOLOGY: No anemia. No bruising. No bleeding.  INTEGUMENTARY: No rashes. No lesions.  MUSCULOSKELETAL: No arthritis. No swelling. No gout.  NEUROLOGIC: No numbness, tingling, or ataxia. No seizure-type activity.  PSYCHIATRIC: No anxiety. No insomnia. No ADD.   DRUG ALLERGIES:  No Known Allergies  VITALS:  Blood pressure 118/81, pulse 99, temperature 97.8 F (36.6 C), temperature source Oral, resp. rate 19, height 5' 2.99" (1.6 m), weight 46.7 kg, SpO2 99 %.  PHYSICAL EXAMINATION:   Physical Exam  GENERAL:  58 y.o.-year-old patient lying in the bed with no acute distress.  EYES: Pupils equal, round, reactive to light and accommodation. No scleral icterus. Extraocular muscles intact.  HEENT: Head atraumatic, normocephalic. Oropharynx and nasopharynx clear.  NECK:  Supple, no jugular venous distention. No thyroid enlargement, no tenderness.  LUNGS: Improved breath sounds bilaterally,. No use of accessory muscles of respiration.  Scattered Rales heard in both lungs CARDIOVASCULAR: S1, S2  normal. No murmurs, rubs, or gallops.  ABDOMEN: Soft, nontender, nondistended. Bowel sounds present. No organomegaly or mass.  EXTREMITIES: No cyanosis, clubbing or edema b/l.    NEUROLOGIC: Cranial nerves II through XII are intact. No focal Motor or sensory deficits b/l.   PSYCHIATRIC: The patient is alert and oriented x 3.  SKIN: No obvious rash, lesion, or ulcer.   LABORATORY PANEL:   CBC Recent Labs  Lab 10/03/18 0505  WBC 14.4*  HGB 8.5*  HCT 26.0*  PLT 182   ------------------------------------------------------------------------------------------------------------------ Chemistries  Recent Labs  Lab 09/30/18 0404  10/03/18 0505  NA 131*   < > 129*  K 3.0*   < > 3.6  CL 87*   < > 83*  CO2 35*   < > 35*  GLUCOSE 145*   < > 111*  BUN 17   < > 23*  CREATININE 0.74   < > 0.79  CALCIUM 8.3*   < > 8.7*  MG 2.3  --   --    < > = values in this interval not displayed.   ------------------------------------------------------------------------------------------------------------------  Cardiac Enzymes Recent Labs  Lab 09/28/18 1251  TROPONINI <0.03   ------------------------------------------------------------------------------------------------------------------  RADIOLOGY:  No results found.   ASSESSMENT AND PLAN:  58 year old male patient with history of stage IV pancreatic cancer, recently diagnosed metastatic lung cancer, deafness, numbness, hypertension currently under hospitalist service for shortness of breath  *Acute on chronic respiratory failure with hypoxia Stable Secondary to metastatic cancer and pneumonia Weaned off BiPAP On oxygen via nasal cannula currently at 4liter Continue breathing treatments PRN, antitussives  Wean to oxygen via nasal canula to 2liter  *Acute hypokalemia Repleted   *Acute hyponatremia  Most likely  secondary to pneumonia, cannot rule out possible SIADH from metastatic cancer as well Salt tabs 3 times daily  *Acute  pulmonary edema  Resolved   *Acute Pneumonia  Resolving  Continue pneumonia protocol, Levaquin to complete antibiotic course  *Chronic Metastatic pancreatic cancer Patient is DNR, palliative care following  Disposition to home with hospice versus skilled nursing facility with hospice once we get approval from DSS patients brother POA  All the records are reviewed and case discussed with Care Management/Social Worker. Management plans discussed with the patient, family and they are in agreement.  CODE STATUS: DNR  DVT Prophylaxis: SCDs  TOTAL TIME TAKING CARE OF THIS PATIENT: 34 minutes.   POSSIBLE D/C IN 1 to 2 DAYS, DEPENDING ON CLINICAL CONDITION.  Avel Peace Ashawna Hanback M.D on 10/03/2018 at 12:52 PM  Between 7am to 6pm - Pager - [phone]  After 6pm go to [email or web address] - password [email or web address] [facility]  Office  [phone]  CC: Primary care physician; Patient, No Pcp Per  Note: This dictation was prepared with Dragon dictation along with smaller phrase technology. Any transcriptional errors that result from this process are unintentional.

## 2018-10-03 NOTE — Clinical Social Work Note (Signed)
CSW spoke with Barrett Henle, [place] director regarding patient. [place] states that the plan is for patient to go to [facility]. CSW explained that patient's brother had shown interest in taking him home with hospice services. [place] states that as long as patient had someone there with him to help care for him then that plan would be ok, although she would prefer that he go to facility for safety.  CSW contacted patient's brother Jjesus Dingley [phone]. CSW explained that patient will need hospice services and asked if he is still wanting to take patient home. Brother states that he can not take patient home with him because he just had back surgery and is unable to care for patient. Brother states that he would like patient placed at [facility] if possible.  CSW contacted Angela Nevin at [facility] regarding patient. Angela Nevin is not available right now and CSW left message for her. CSW awaiting a call back to see if they can still accept patient. CSW will continue to follow for transition of care.   [place], [place]

## 2018-10-03 NOTE — Clinical Social Work Note (Signed)
CSW spoke with Angela Nevin at [facility] regarding patient. Angela Nevin states that they could accept patient but they don't have any male beds available right now. She says that she will contact CSW if that changes and a bed becomes available. CSW contacted [place] and let her know that [facility] no longer has a bed available. [place] states that she will search for other options for patient. CSW let her know that patient is medically stable for discharge and just waiting on placement at this time. CSW also faxed clinical information to [place] in order to find placement. CSW will continue to follow for discharge planning.    [place], [place]

## 2018-10-03 NOTE — Progress Notes (Signed)
[place]  Telephone:([phone] [phone]   Name: Eugene Burton Date: 10/03/2018 MRN: [id number]  DOB: 21-May-1961  Patient Care Team: Patient, No Pcp Per as PCP - General (General Practice) Clent Jacks, RN as Registered Nurse    REASON FOR CONSULTATION: Palliative Care consult requested for this57 y.o.malewith multiple medical problems including stage IV colon cancer, chronic respiratory failure requiring O2, deafness, malnutrition, GERD, and hypertension.Patient was recently hospitalized 09/18/2018 to 09/22/2018 with cough and dehydration and was found to have multiple lung masses bilaterally on CT scan. Abdominal CT revealed pancreatic lesions, liver lesions, a mass in the left kidney, and peritoneal carcinomatosis. He underwent biopsy of the omental lesion with pathology showing metastatic adenocarcinoma likely of colorectal origin. However, it is possible that patient has multiple primary cancers. Patient is now admitted 09/28/17 with acute on chronic respiratory failure. CXR reveals extensive bilateral opacities possible due to lymphangitic spread of tumor vs multifocal PNA vs pulmonary edema. Patient initially required BIPAP. He was referred to palliative care to help address goals.   CODE STATUS: DNR  PAST MEDICAL HISTORY: Past Medical History:  Diagnosis Date  . COPD (chronic obstructive pulmonary disease) ([place])   . Deaf    patient needs interpreter   . Dyspnea   . GERD (gastroesophageal reflux disease)   . Hypertension   . Kidney mass   . Pancreatic mass     PAST SURGICAL HISTORY:  Past Surgical History:  Procedure Laterality Date  . broken leg    . 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  MEDICATIONS:  Current Facility-Administered Medications  Medication Dose Route Frequency Provider Last Rate Last Dose  . 0.9 %  sodium chloride infusion  250 mL Intravenous PRN Demetrios Loll, MD 10 mL/hr at 10/02/18 0639 20 mL at 10/02/18 0639  . acetaminophen (TYLENOL) tablet 650 mg  650 mg Oral Q6H PRN Demetrios Loll, MD       Or  . acetaminophen (TYLENOL) suppository 650 mg  650 mg Rectal Q6H PRN Demetrios Loll, MD      . albuterol (PROVENTIL) (2.5 MG/3ML) 0.083% nebulizer solution 2.5 mg  2.5 mg Nebulization Q2H PRN Demetrios Loll, MD      . albuterol (PROVENTIL) (2.5 MG/3ML) 0.083% nebulizer solution 2.5 mg  2.5 mg Nebulization TID Demetrios Loll, MD   2.5 mg at 10/03/18 0756  . bisacodyl (DULCOLAX) EC tablet 5 mg  5 mg Oral Daily PRN Demetrios Loll, MD      . chlorhexidine (PERIDEX) 0.12 % solution 15 mL  15 mL Mouth Rinse BID Pyreddy, Pavan, MD      . chlorpheniramine-HYDROcodone (TUSSIONEX) 10-8 MG/5ML suspension 5 mL  5 mL Oral Q12H PRN Awilda Bill, NP   5 mL at 09/30/18 2107  . famotidine (PEPCID) tablet 20 mg  20 mg Oral Daily Demetrios Loll, MD   20 mg at 10/03/18 1016  . feeding supplement (ENSURE ENLIVE) (ENSURE ENLIVE) liquid 237 mL  237 mL Oral TID BM Rosine Door, MD  237 mL at 10/02/18 0907  . furosemide (LASIX) injection 40 mg  40 mg Intravenous Q12H Saundra Shelling, MD   40 mg at 10/02/18 1758  . guaiFENesin-dextromethorphan (ROBITUSSIN DM) 100-10 MG/5ML syrup 5 mL  5 mL Oral Q4H PRN Rosine Door, MD   5 mL at 10/01/18 0755  . heparin injection 5,000 Units  5,000 Units Subcutaneous Q8H Demetrios Loll, MD   5,000 Units at 10/03/18 1316  . hydrALAZINE (APRESOLINE) injection 10 mg  10 mg Intravenous Q6H PRN Demetrios Loll, MD      . HYDROcodone-acetaminophen (NORCO/VICODIN) 5-325  MG per tablet 1-2 tablet  1-2 tablet Oral Q4H PRN Demetrios Loll, MD   1 tablet at 10/02/18 1301  . levofloxacin (LEVAQUIN) tablet 500 mg  500 mg Oral Daily Pyreddy, Pavan, MD   500 mg at 10/03/18 1014  . losartan (COZAAR) tablet 50 mg  50 mg Oral Daily Demetrios Loll, MD   50 mg at 10/03/18 1014  . MEDLINE mouth rinse  15 mL Mouth Rinse q12n4p Pyreddy, Reatha Harps, MD   15 mL at 10/02/18 1758  . morphine 2 MG/ML injection 1-2 mg  1-2 mg Intravenous Q4H PRN Awilda Bill, NP   2 mg at 09/30/18 0454  . morphine 4 MG/ML injection 4 mg  4 mg Intravenous Q4H PRN Demetrios Loll, MD      . multivitamin with minerals tablet 1 tablet  1 tablet Oral Daily Rosine Door, MD   1 tablet at 10/02/18 6283  . nebivolol (BYSTOLIC) tablet 5 mg  5 mg Oral Daily Demetrios Loll, MD   5 mg at 10/03/18 1015  . ondansetron (ZOFRAN) tablet 4 mg  4 mg Oral Q6H PRN Demetrios Loll, MD       Or  . ondansetron [facility]) injection 4 mg  4 mg Intravenous Q6H PRN Demetrios Loll, MD      . senna-docusate (Senokot-S) tablet 1 tablet  1 tablet Oral QHS PRN Demetrios Loll, MD      . sodium chloride flush (NS) 0.9 % injection 3 mL  3 mL Intravenous Q12H Demetrios Loll, MD   3 mL at 10/03/18 1045  . sodium chloride flush (NS) 0.9 % injection 3 mL  3 mL Intravenous PRN Demetrios Loll, MD        VITAL SIGNS: BP 118/81 (BP Location: Left Arm)   Pulse 99   Temp 97.8 F (36.6 C) (Oral)   Resp 19   Ht 5' 2.99" (1.6 m)   Wt 103 lb (46.7 kg)   SpO2 99%   BMI 18.25 kg/m  Filed Weights   10/01/18 0522 10/02/18 0406 10/03/18 0430  Weight: 100 lb 1.4 oz (45.4 kg) 99 lb 10.4 oz (45.2 kg) 103 lb (46.7 kg)    Estimated body mass index is 18.25 kg/m as calculated from the following:   Height as of this encounter: 5' 2.99" (1.6 m).   Weight as of this encounter: 103 lb (46.7 kg).  LABS: CBC:    Component Value Date/Time   WBC 14.4 (H) 10/03/2018 0505   HGB 8.5 (L) 10/03/2018 0505   HGB 12.0 (L) 06/01/2018 1940   HCT 26.0 (L) 10/03/2018 0505   HCT 36.7 (L)  06/01/2018 1940   PLT 182 10/03/2018 0505   PLT 382 06/01/2018 1940   MCV 85.5 10/03/2018 0505   MCV 88 06/01/2018 1940   NEUTROABS 7.8 (H) 09/30/2018 0404   NEUTROABS 5.5 06/01/2018 1940   LYMPHSABS 0.6 (L) 09/30/2018 0404   LYMPHSABS 1.6 06/01/2018 1940  MONOABS 0.9 09/30/2018 0404   EOSABS 0.1 09/30/2018 0404   EOSABS 0.1 06/01/2018 1940   BASOSABS 0.0 09/30/2018 0404   BASOSABS 0.0 06/01/2018 1940   Comprehensive Metabolic Panel:    Component Value Date/Time   NA 129 (L) 10/03/2018 0505   NA 141 06/01/2018 1940   K 3.6 10/03/2018 0505   CL 83 (L) 10/03/2018 0505   CO2 35 (H) 10/03/2018 0505   BUN 23 (H) 10/03/2018 0505   BUN 11 06/01/2018 1940   CREATININE 0.79 10/03/2018 0505   GLUCOSE 111 (H) 10/03/2018 0505   CALCIUM 8.7 (L) 10/03/2018 0505   AST 24 09/18/2018 1806   ALT 17 09/18/2018 1806   ALKPHOS 279 (H) 09/18/2018 1806   BILITOT 0.6 09/18/2018 1806   BILITOT 0.3 06/01/2018 1940   PROT 6.3 (L) 09/18/2018 1806   PROT 6.5 06/01/2018 1940   ALBUMIN 3.0 (L) 09/18/2018 1806   ALBUMIN 4.1 06/01/2018 1940    RADIOGRAPHIC STUDIES: Dg Chest 2 View  Result Date: 09/18/2018 CLINICAL DATA:  Cough. Weight loss, patient has been feeling poorly for 3 months. EXAM: CHEST - 2 VIEW COMPARISON:  Remote chest radiograph 04/07/2008 FINDINGS: Heart is normal in size. Diffuse peribronchial and interstitial thickening. Slight bilateral hilar prominence. No confluent airspace disease or radiographic findings of mass. Minimal fluid in the fissures without subpulmonic effusion. No acute osseous abnormalities are seen. IMPRESSION: 1. Diffuse peribronchial interstitial thickening with diagnostic considerations of pulmonary edema or bronchitis. 2. Mild bilateral hilar prominence is nonspecific, may be vascular overlap versus adenopathy. Consider further evaluation with chest CT with contrast for further evaluation. 3. No radiographic findings of pneumonia or pulmonary mass. Electronically  Signed   By: Keith Rake M.D.   On: 09/18/2018 22:39   Ct Chest W Contrast  Result Date: 09/18/2018 EXAM: CT CHEST, ABDOMEN, AND PELVIS WITH CONTRAST TECHNIQUE: Multidetector CT imaging of the chest, abdomen and pelvis was performed following the standard protocol during bolus administration of intravenous contrast. CONTRAST:  68mL OMNIPAQUE IOHEXOL 300 MG/ML  SOLN COMPARISON:  Same day CXR FINDINGS: CT CHEST FINDINGS Cardiovascular: Conventional branch pattern of the great vessels. Nonaneurysmal thoracic aorta. No large central pulmonary embolus. Heart size is top normal without pericardial effusion or thickening. Mediastinum/Nodes: Mild thyromegaly with retroclavicular extension of the thyroid gland. No dominant mass. Midline patent trachea. Mainstem bronchi appear patent. Mild enlargement of mediastinal lymph nodes in the right upper paratracheal, prevascular, left lower paratracheal and bilateral hilar lymph node stations ranging in size from 10 mm through 12 mm. The CT appearance of the esophagus is unremarkable. Lungs/Pleura: Interstitial thickening likely representing interstitial edema with ill-defined patchy multilobar masslike bilateral airspace opacities and peribronchial thickening are identified bilaterally. The largest is noted in the superior segment of left lower lobe measuring approximately 2 x 2.4 x 1.8 cm with smaller ill-defined nodular opacities scattered throughout both lungs. No effusion or pneumothorax. Musculoskeletal: No chest wall mass or suspicious bone lesions identified. CT ABDOMEN PELVIS FINDINGS Hepatobiliary: Concerning ill-defined hypodense lesion in the posterior segment of right hepatic lobe measuring 4.1 x 3.7 x 4.7 cm in transverse by AP by craniocaudad suspicious for hepatic metastasis. No biliary dilatation. Nondistended gallbladder containing a 1.6 cm gallstone. Pancreas: Hypodense ill-defined mass measuring 4.8 x 4.5 x 4.4 cm centered within the body of the pancreas  concerning for pancreatic carcinoma. No ductal dilatation. Patent portal and splenic veins. Spleen: Normal Adrenals/Urinary Tract: Bilobed hypodense masslike abnormality in the left adrenal gland likely representing metastatic disease measuring at  least 3.8 x 1.6 cm, series 2/49. Exophytic complex cystic mass arising lower pole of the left kidney measuring 4.5 x 3.6 x 5 cm with mural nodule along the caudal aspect measuring up to 1.2 x 1.5 x 1.8 cm. Hypodense lesions of the right kidney are more likely to represent simple cysts though some are too small to further characterize. The largest is in the lower pole posteriorly measuring 1.8 cm in diameter. Stomach/Bowel: The stomach is nondistended. Normal small bowel rotation. Slight fluid-filled distention of distal and mid small bowel loops without mechanical obstruction. Normal appendix, terminal and distal ileum. Moderate transmural thickening of the transverse colon with ill-defined masslike abnormality involving the distal transverse colon measuring 5.2 x 7.4 x 2.9 cm, series 2/59 with single wall thickness up to 2.4 cm. There are adjacent peritoneal masses concerning for peritoneal carcinomatosis measuring 1.7 cm in the left hemiabdomen, series 2/60 with smaller mesenteric nodules in the left upper quadrant measuring up to 0.8 cm and 1.4 cm. Mesenteric edema and mesenteric nodules are also noted further caudad within the left hemiabdomen, the largest measuring up to 2 cm, series 2/68. Smaller left pelvic mesenteric nodule measuring 0.8 cm is seen on the left. Vascular/Lymphatic: Mild aortoiliac atherosclerosis without aneurysm or dissection. No retroperitoneal or definite mesenteric adenopathy. Reproductive: Prostate is unremarkable. Other: No free air or free fluid. Musculoskeletal: Suspicious osteolytic appearance of the right femoral head on axial image 107, series 2. A more circumscribed left femoral neck lesion measuring up to 1.5 cm with ill-defined right  posterior iliac bone 2.1 cm lesion. IMPRESSION: Chest CT: 1. Ill-defined masslike opacities scattered throughout both lungs with associated hilar and mediastinal lymphadenopathy concerning for metastatic disease. Given the masslike abnormalities noted within the abdomen involving the pancreas and transverse colon, suspect that these are more likely metastatic and not due to a primary pulmonary neoplasm. 2. Nonaneurysmal thoracic aorta. 3. No acute pulmonary embolus. CT AP: 1. Hypodense mass centered within the body of the pancreas measuring 4.8 x 4.5 x 4.4 cm concerning for pancreatic carcinoma. 2. Masslike abnormality of the distal transverse colon measuring 5.2 x 7.4 x 2.9 cm with single wall thickness up to 2.4 cm. No bowel obstruction. This is also concerning for colonic neoplasm. 3. Hypodense mass of the right hepatic lobe measuring 4.1 x 3.7 x 4.7 cm concerning for hepatic metastasis. 4. Cystic mass arising off the lower pole of the left kidney with solid nodular component measuring 4.5 x 3.6 x 5 cm with the mural nodule measuring 1.2 x 1.5 x 1.8 cm. Cystic renal cell carcinoma is not excluded. 5. Findings concerning for peritoneal carcinomatosis with mesenteric nodules as above described. No ascites however is identified. 6. 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 These results were called by telephone at the time of interpretation on 09/18/2018 at 11:58 pm to Dr. Mable Paris , who verbally acknowledged these results. Electronically Signed   By: Ashley Royalty M.D.   On: 09/18/2018 23:58   Ct Abdomen Pelvis W Contrast  Result Date: 09/18/2018 EXAM: CT CHEST, ABDOMEN, AND PELVIS WITH CONTRAST TECHNIQUE: Multidetector CT imaging of the chest, abdomen and pelvis was performed following the standard protocol during bolus administration of intravenous contrast. CONTRAST:  15mL OMNIPAQUE IOHEXOL 300 MG/ML  SOLN COMPARISON:  Same day CXR FINDINGS: CT CHEST FINDINGS Cardiovascular:  Conventional branch pattern of the great vessels. Nonaneurysmal thoracic aorta. No large central pulmonary embolus. Heart size is top normal without pericardial effusion or thickening. Mediastinum/Nodes: Mild  thyromegaly with retroclavicular extension of the thyroid gland. No dominant mass. Midline patent trachea. Mainstem bronchi appear patent. Mild enlargement of mediastinal lymph nodes in the right upper paratracheal, prevascular, left lower paratracheal and bilateral hilar lymph node stations ranging in size from 10 mm through 12 mm. The CT appearance of the esophagus is unremarkable. Lungs/Pleura: Interstitial thickening likely representing interstitial edema with ill-defined patchy multilobar masslike bilateral airspace opacities and peribronchial thickening are identified bilaterally. The largest is noted in the superior segment of left lower lobe measuring approximately 2 x 2.4 x 1.8 cm with smaller ill-defined nodular opacities scattered throughout both lungs. No effusion or pneumothorax. Musculoskeletal: No chest wall mass or suspicious bone lesions identified. CT ABDOMEN PELVIS FINDINGS Hepatobiliary: Concerning ill-defined hypodense lesion in the posterior segment of right hepatic lobe measuring 4.1 x 3.7 x 4.7 cm in transverse by AP by craniocaudad suspicious for hepatic metastasis. No biliary dilatation. Nondistended gallbladder containing a 1.6 cm gallstone. Pancreas: Hypodense ill-defined mass measuring 4.8 x 4.5 x 4.4 cm centered within the body of the pancreas concerning for pancreatic carcinoma. No ductal dilatation. Patent portal and splenic veins. Spleen: Normal Adrenals/Urinary Tract: Bilobed hypodense masslike abnormality in the left adrenal gland likely representing metastatic disease measuring at least 3.8 x 1.6 cm, series 2/49. Exophytic complex cystic mass arising lower pole of the left kidney measuring 4.5 x 3.6 x 5 cm with mural nodule along the caudal aspect measuring up to 1.2 x 1.5 x  1.8 cm. Hypodense lesions of the right kidney are more likely to represent simple cysts though some are too small to further characterize. The largest is in the lower pole posteriorly measuring 1.8 cm in diameter. Stomach/Bowel: The stomach is nondistended. Normal small bowel rotation. Slight fluid-filled distention of distal and mid small bowel loops without mechanical obstruction. Normal appendix, terminal and distal ileum. Moderate transmural thickening of the transverse colon with ill-defined masslike abnormality involving the distal transverse colon measuring 5.2 x 7.4 x 2.9 cm, series 2/59 with single wall thickness up to 2.4 cm. There are adjacent peritoneal masses concerning for peritoneal carcinomatosis measuring 1.7 cm in the left hemiabdomen, series 2/60 with smaller mesenteric nodules in the left upper quadrant measuring up to 0.8 cm and 1.4 cm. Mesenteric edema and mesenteric nodules are also noted further caudad within the left hemiabdomen, the largest measuring up to 2 cm, series 2/68. Smaller left pelvic mesenteric nodule measuring 0.8 cm is seen on the left. Vascular/Lymphatic: Mild aortoiliac atherosclerosis without aneurysm or dissection. No retroperitoneal or definite mesenteric adenopathy. Reproductive: Prostate is unremarkable. Other: No free air or free fluid. Musculoskeletal: Suspicious osteolytic appearance of the right femoral head on axial image 107, series 2. A more circumscribed left femoral neck lesion measuring up to 1.5 cm with ill-defined right posterior iliac bone 2.1 cm lesion. IMPRESSION: Chest CT: 1. Ill-defined masslike opacities scattered throughout both lungs with associated hilar and mediastinal lymphadenopathy concerning for metastatic disease. Given the masslike abnormalities noted within the abdomen involving the pancreas and transverse colon, suspect that these are more likely metastatic and not due to a primary pulmonary neoplasm. 2. Nonaneurysmal thoracic aorta. 3. No  acute pulmonary embolus. CT AP: 1. Hypodense mass centered within the body of the pancreas measuring 4.8 x 4.5 x 4.4 cm concerning for pancreatic carcinoma. 2. Masslike abnormality of the distal transverse colon measuring 5.2 x 7.4 x 2.9 cm with single wall thickness up to 2.4 cm. No bowel obstruction. This is also concerning for colonic neoplasm. 3. Hypodense  mass of the right hepatic lobe measuring 4.1 x 3.7 x 4.7 cm concerning for hepatic metastasis. 4. Cystic mass arising off the lower pole of the left kidney with solid nodular component measuring 4.5 x 3.6 x 5 cm with the mural nodule measuring 1.2 x 1.5 x 1.8 cm. Cystic renal cell carcinoma is not excluded. 5. Findings concerning for peritoneal carcinomatosis with mesenteric nodules as above described. No ascites however is identified. 6. 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 These results were called by telephone at the time of interpretation on 09/18/2018 at 11:58 pm to Dr. Mable Paris , who verbally acknowledged these results. Electronically Signed   By: Ashley Royalty M.D.   On: 09/18/2018 23:58   Ct Biopsy  Result Date: 09/21/2018 CLINICAL DATA:  Pancreatic mass, peritoneal masses, liver mass, left renal mass, left adrenal mass and pulmonary masses. 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 Presenting for biopsy of one of the peritoneal masses. EXAM: CT GUIDED CORE BIOPSY OF PERITONEAL MASS ANESTHESIA/SEDATION: 2.0 mg IV Versed; 75 mcg IV Fentanyl Total Moderate Sedation Time:  42 minutes. The patient's level of consciousness and physiologic status were continuously monitored during the procedure by Radiology nursing. PROCEDURE: The procedure risks, benefits, and alternatives were explained to the patient. Questions regarding the procedure were encouraged and answered. The patient understands and consents to the procedure. A time-out was performed prior to initiating the procedure. The operative field was prepped with  chlorhexidine in a sterile fashion, and a sterile drape was applied covering the operative field. A sterile gown and sterile gloves were used for the procedure. Local anesthesia was provided with 1% Lidocaine. CT was performed in a supine position through the mid to lower abdomen. A site was chosen for biopsy along the left abdominal wall. Under CT guidance, a 17 gauge trocar needle was advanced. After confirming needle tip position, a total of 4 coaxial 18 gauge core biopsy samples were obtained and submitted in formalin. Additional CT was performed after biopsy. COMPLICATIONS: None FINDINGS: Peritoneal soft tissue nodule in the left mid lateral peritoneal cavity was chosen for biopsy and measures approximately 2 cm in maximal diameter. Solid tissue was obtained. There were no immediate complications. IMPRESSION: CT-guided core biopsy performed of peritoneal mass in the left mid lateral abdomen. Electronically Signed   By: Aletta Edouard M.D.   On: 09/21/2018 14:19   Dg Chest Port 1 View  Result Date: 09/29/2018 CLINICAL DATA:  Acute respiratory failure EXAM: PORTABLE CHEST 1 VIEW COMPARISON:  Chest radiograph dated 09/28/2018. CT chest dated 09/18/2018. FINDINGS: Increased interstitial markings in the lungs bilaterally. When correlating with recent CT, this appearance favors metastatic disease, although superimposed infection or interstitial edema is not excluded. No definite pleural effusions. No pneumothorax. The heart is normal in size. IMPRESSION: Increased interstitial markings in the lungs bilaterally, favoring metastatic disease when correlating with recent CT. Electronically Signed   By: Julian Hy M.D.   On: 09/29/2018 03:39   Dg Chest Port 1 View  Result Date: 09/28/2018 CLINICAL DATA:  Dyspnea.  History of COPD and lung carcinoma. EXAM: PORTABLE CHEST 1 VIEW COMPARISON:  CT chest, abdomen and pelvis and PA and lateral chest 09/18/2018. FINDINGS: Extensive bilateral interstitial and  alveolar opacities or predominantly perihilar and appear somewhat worse than on the comparison examination. Heart size is normal. No pneumothorax or pleural effusion. No acute bony abnormality. IMPRESSION: Extensive bilateral perihilar alveolar opacities could be due to pulmonary edema, multifocal pneumonia or possibly lymphangitic spread of  tumor. Electronically Signed   By: Inge Rise M.D.   On: 09/28/2018 13:19    PERFORMANCE STATUS (ECOG) : 3 - Symptomatic, >50% confined to bed  Review of Systems As noted above. Otherwise, a complete review of systems is negative.  Physical Exam General: NAD, frail appearing, thin  Pulmonary: clear ant fields, unlabored, on O2 Abdomen: soft, nontender, + bowel sounds Extremities: no edema Skin: no rashes Neurological: Weakness, nonvocal, alert  IMPRESSION: Patient denies any acute concerns or complaints. He seems frustrated but would not elucidate on details through the interpreter. He denies pain.   Spoke with SW who is working on discharge plan. Awaiting input from DSS regarding placement.   PLAN: Continue current scope of treatment ALF with hospice vs home with hospice   Time Total: 20 minutes  Visit consisted of counseling and education dealing with the complex and emotionally intense issues of symptom management and palliative care in the setting of serious and potentially life-threatening illness.Greater than 50%  of this time was spent counseling and coordinating care related to the above assessment and plan.  Signed by: Altha Harm, PhD, NP-C [phone] (Work Cell)

## 2018-10-04 MED ORDER — SODIUM CHLORIDE 0.9 % IV BOLUS
500.0000 mL | Freq: Once | INTRAVENOUS | Status: AC
  Administered 2018-10-04: 23:00:00 500 mL via INTRAVENOUS

## 2018-10-04 MED ORDER — POLYETHYLENE GLYCOL 3350 17 G PO PACK: 17.0000 g | PACK | Freq: Every day | ORAL | Status: DC

## 2018-10-04 MED ORDER — SODIUM CHLORIDE 1 G PO TABS
1.0000 g | ORAL_TABLET | Freq: Three times a day (TID) | ORAL | Status: DC
  Administered 2018-10-04 – 2018-10-05 (×4): 1 g via ORAL
  Filled 2018-10-04 (×5): qty 1

## 2018-10-04 NOTE — Clinical Social Work Note (Signed)
Weston with [facility] came to visit patient. Caryl Comes states that they have obtained a bed at [facility] for patient. Dustin from [facility] will come evaluate patient tomorrow. CSW will continue to follow for discharge planning.   [place], [place]

## 2018-10-04 NOTE — Progress Notes (Addendum)
Nutrition Follow Up Notes   DOCUMENTATION CODES:   Severe malnutrition in context of chronic illness  INTERVENTION:   Ensure Enlive po TID, each supplement provides 350 kcal and 20 grams of protein  Magic cup TID with meals, each supplement provides 290 kcal and 9 grams of protein  MVI daily   Bowel regimen per MD  NUTRITION DIAGNOSIS:   Severe Malnutrition related to cancer and cancer related treatments as evidenced by 18 percent weight loss in 4 months, moderate fat depletion, moderate to severe muscle depletions.  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    Pt s/p peritoneal mass biopsy on 1/9 found to have metastatic adenocarcinoma   Pt with fair appetite and oral intake; pt eating anywhere from 20-100% of meals and refusing most of the Ensure supplements. Per chart, pt with ~10lb wt loss since admit. No BM since 1/18; recommend bowel regimen per MD. Pt to discharge home vs SNF with hospice.    Medications reviewed and include: pepcid, lasix, heparin, MVI, miralax, NaCl   Labs reviewed: Na 129(L), BUN 23(H) Wbc- 14.4(H), Hgb 8.5(L), Hct 26.0(L) P 3.3 wnl, Mg 2.3 wnl- 1/18  Diet Order:   Diet Order            Diet regular Room service appropriate? Yes; Fluid consistency: Thin  Diet effective now             EDUCATION NEEDS:   Education needs have been addressed  Skin:  Skin Assessment: Reviewed RN Assessment(ecchymosis, abdominal puncture s/p biopsy )  Last BM:  1/18  Height:   Ht Readings from Last 1 Encounters:  09/28/18 5' 2.99" (1.6 m)    Weight:   Wt Readings from Last 1 Encounters:  10/04/18 47.1 kg    Ideal Body  Weight:  56.3 kg  BMI:  Body mass index is 18.4 kg/m.  Estimated Nutritional Needs:   Kcal:  1700-1900kcal/day   Protein:  77-87g/day   Fluid:  1.5L/day or per MD  Koleen Distance MS, RD, LDN Pager #- [phone] Office#- [phone] After Hours Pager: [phone]

## 2018-10-04 NOTE — Care Management Important Message (Signed)
Important Message  Patient Details  Name: FINDLAY DAGHER MRN: [id number] Date of Birth: September 30, 1960   Medicare Important Message Given:  Yes  Patient sleeping. Left signed copy of Important Message from Medicare at bedside. No family in the room.   Juliann Pulse A Mayan Kloepfer 10/04/2018, 10:49 AM

## 2018-10-04 NOTE — Progress Notes (Signed)
[facility] at [place] NAME: Eugene Burton    MR#:  [id number]  DATE OF BIRTH:  Jun 16, 1961  SUBJECTIVE:  Communicated with the help of interpretor Deaf and dumb Awaiting approval for placement versus home with hospice, discussed with case management/social services  REVIEW OF SYSTEMS:    ROS  CONSTITUTIONAL: No documented fever. Has fatigue, weakness. No weight gain, no weight loss.  EYES: No blurry or double vision.  ENT: No tinnitus. No postnasal drip. No redness of the oropharynx.  RESPIRATORY: occasional cough, no wheeze, no hemoptysis. Has decreased dyspnea.  CARDIOVASCULAR: No chest pain. No orthopnea. No palpitations. No syncope.  GASTROINTESTINAL: No nausea, no vomiting or diarrhea. No abdominal pain. No melena or hematochezia.  GENITOURINARY: No dysuria or hematuria.  ENDOCRINE: No polyuria or nocturia. No heat or cold intolerance.  HEMATOLOGY: No anemia. No bruising. No bleeding.  INTEGUMENTARY: No rashes. No lesions.  MUSCULOSKELETAL: No arthritis. No swelling. No gout.  NEUROLOGIC: No numbness, tingling, or ataxia. No seizure-type activity.  PSYCHIATRIC: No anxiety. No insomnia. No ADD.   DRUG ALLERGIES:  No Known Allergies  VITALS:  Blood pressure 93/60, pulse 100, temperature 98 F (36.7 C), temperature source Oral, resp. rate 18, height 5' 2.99" (1.6 m), weight 47.1 kg, SpO2 100 %.  PHYSICAL EXAMINATION:   Physical Exam  GENERAL:  58 y.o.-year-old patient lying in the bed with no acute distress.  EYES: Pupils equal, round, reactive to light and accommodation. No scleral icterus. Extraocular muscles intact.  HEENT: Head atraumatic, normocephalic. Oropharynx and nasopharynx clear.  NECK:  Supple, no jugular venous distention. No thyroid enlargement, no tenderness.  LUNGS: Improved breath sounds bilaterally,. No use of accessory muscles of respiration.  Scattered Rales heard in both lungs CARDIOVASCULAR: S1, S2 normal.  No murmurs, rubs, or gallops.  ABDOMEN: Soft, nontender, nondistended. Bowel sounds present. No organomegaly or mass.  EXTREMITIES: No cyanosis, clubbing or edema b/l.    NEUROLOGIC: Cranial nerves II through XII are intact. No focal Motor or sensory deficits b/l.   PSYCHIATRIC: The patient is alert and oriented x 3.  SKIN: No obvious rash, lesion, or ulcer.   LABORATORY PANEL:   CBC Recent Labs  Lab 10/03/18 0505  WBC 14.4*  HGB 8.5*  HCT 26.0*  PLT 182   ------------------------------------------------------------------------------------------------------------------ Chemistries  Recent Labs  Lab 09/30/18 0404  10/03/18 0505  NA 131*   < > 129*  K 3.0*   < > 3.6  CL 87*   < > 83*  CO2 35*   < > 35*  GLUCOSE 145*   < > 111*  BUN 17   < > 23*  CREATININE 0.74   < > 0.79  CALCIUM 8.3*   < > 8.7*  MG 2.3  --   --    < > = values in this interval not displayed.   ------------------------------------------------------------------------------------------------------------------  Cardiac Enzymes Recent Labs  Lab 09/28/18 1251  TROPONINI <0.03   ------------------------------------------------------------------------------------------------------------------  RADIOLOGY:  No results found.   ASSESSMENT AND PLAN:  58 year old male patient with history of stage IV pancreatic cancer, recently diagnosed metastatic lung cancer, deafness, numbness, hypertension currently under hospitalist service for shortness of breath  *Acute on chronic respiratory failure with hypoxia Stable Secondary to metastatic cancer and pneumonia Weaned off BiPAP Wean O2 off as tolerated, breathing treatments PRN, antitussives Awaiting placement with hospice with expectant management  *Acute hypokalemia Repleted  Plan of care as stated above  *Acute hyponatremia  Most likely  secondary to pneumonia, cannot rule out possible SIADH from metastatic cancer as well Salt tabs 3 times daily Plan  of care as stated above  *Acute pulmonary edema  Resolved  Plan of care as stated above  *Acute Pneumonia  Resolving  Continue pneumonia protocol, Levaquin to complete antibiotic course Plan of care as stated above  *Chronic Metastatic pancreatic cancer Patient is DNR, palliative care input appreciated Plan of care as stated above  Disposition to home with hospice versus skilled nursing facility with hospice once we get approval from DSS patients brother POA  All the records are reviewed and case discussed with Care Management/Social Worker. Management plans discussed with the patient, family and they are in agreement.  CODE STATUS: DNR  DVT Prophylaxis: SCDs  TOTAL TIME TAKING CARE OF THIS PATIENT: 30 minutes.   POSSIBLE D/C IN 1 to 2 DAYS, DEPENDING ON CLINICAL CONDITION.  Avel Peace Dynastee Brummell M.D on 10/04/2018 at 11:24 AM  Between 7am to 6pm - Pager - [phone]  After 6pm go to [email or web address] - password [email or web address] [facility]  Office  [phone]  CC: Primary care physician; Patient, No Pcp Per  Note: This dictation was prepared with Dragon dictation along with smaller phrase technology. Any transcriptional errors that result from this process are unintentional.

## 2018-10-05 ENCOUNTER — Telehealth: Payer: Self-pay | Admitting: *Deleted

## 2018-10-05 LAB — STREP PNEUMONIAE URINARY ANTIGEN: Strep Pneumo Urinary Antigen: NEGATIVE

## 2018-10-05 MED ORDER — ALBUTEROL SULFATE (2.5 MG/3ML) 0.083% IN NEBU: 2.5000 mg | INHALATION_SOLUTION | RESPIRATORY_TRACT | 12 refills | Status: AC | PRN

## 2018-10-05 MED ORDER — ONDANSETRON HCL 4 MG PO TABS: 4.0000 mg | ORAL_TABLET | Freq: Four times a day (QID) | ORAL | 0 refills | Status: AC | PRN

## 2018-10-05 MED ORDER — HYDROCODONE-ACETAMINOPHEN 5-325 MG PO TABS: 1.0000 | ORAL_TABLET | Freq: Four times a day (QID) | ORAL | 0 refills | Status: AC | PRN

## 2018-10-05 MED ORDER — MORPHINE SULFATE (CONCENTRATE) 20 MG/ML PO SOLN: 10.0000 mg | ORAL | 0 refills | Status: AC | PRN

## 2018-10-05 NOTE — Telephone Encounter (Signed)
Call returned to Hospice with [facility] for services

## 2018-10-05 NOTE — Discharge Summary (Signed)
[place] at [place]: Eugene Burton    MR#:  [id number]  DATE OF BIRTH:  1961/08/31  DATE OF ADMISSION:  09/28/2018 ADMITTING PHYSICIAN: Demetrios Loll, MD  DATE OF DISCHARGE: 10/05/2018   PRIMARY CARE PHYSICIAN: Patient, No Pcp Per    ADMISSION DIAGNOSIS:  Acute respiratory failure with hypoxia (HCC) [J96.01]  DISCHARGE DIAGNOSIS:  Active Problems:   Adenocarcinoma of transverse colon (HCC)   Acute respiratory failure with hypoxia (HCC)   Protein-calorie malnutrition, severe   Palliative care encounter   SECONDARY DIAGNOSIS:   Past Medical History:  Diagnosis Date  . COPD (chronic obstructive pulmonary disease) ([place])   . Deaf    patient needs interpreter   . Dyspnea   . GERD (gastroesophageal reflux disease)   . Hypertension   . Kidney mass   . Pancreatic mass     HOSPITAL COURSE:   58 year old male patient with history of stage IV pancreatic cancer, recently diagnosed metastatic lung cancer, deafness, numbness, hypertension currently under hospitalist service for shortness of breath  *Acute on chronic respiratory failure with hypoxia Stable Secondary to metastatic cancer and pneumonia Weaned off BiPAP Wean O2 off as tolerated, breathing treatments PRN, antitussives Awaiting placement with hospice with expectant management  *Acute hypokalemia Repleted  Plan of care as stated above  *Acute hyponatremia  Most likely secondary to pneumonia, cannot rule out possible SIADH from metastatic cancer as well Salt tabs 3 times daily Plan of care as stated above  *Acute pulmonary edema  Resolved  Plan of care as stated above  *Acute Pneumonia  Resolving  Continue pneumonia protocol, Levaquin to complete antibiotic course Plan of care as stated above  *Chronic Metastatic pancreatic cancer Patient is DNR, palliative care input appreciated Plan of care as stated above  Placement in NH with hospice  today.  DISCHARGE CONDITIONS:   Stable.  CONSULTS OBTAINED:    DRUG ALLERGIES:  No Known Allergies  DISCHARGE MEDICATIONS:   Allergies as of 10/05/2018   No Known Allergies     Medication List    STOP taking these medications   albuterol 108 (90 Base) MCG/ACT inhaler Commonly known as:  PROVENTIL HFA;VENTOLIN HFA Replaced by:  albuterol (2.5 MG/3ML) 0.083% nebulizer solution   losartan 50 MG tablet Commonly known as:  COZAAR   losartan-hydrochlorothiazide 50-12.5 MG tablet Commonly known as:  HYZAAR     TAKE these medications   albuterol (2.5 MG/3ML) 0.083% nebulizer solution Commonly known as:  PROVENTIL Take 3 mLs (2.5 mg total) by nebulization every 2 (two) hours as needed for wheezing or shortness of breath. Replaces:  albuterol 108 (90 Base) MCG/ACT inhaler   guaiFENesin-codeine 100-10 MG/5ML syrup Take 10 mLs by mouth every 6 (six) hours as needed for cough.   hydrochlorothiazide 12.5 MG tablet Commonly known as:  HYDRODIURIL Take 1 tablet (12.5 mg total) by mouth daily.   HYDROcodone-acetaminophen 5-325 MG tablet Commonly known as:  NORCO/VICODIN Take 1 tablet by mouth every 6 (six) hours as needed for moderate pain.   morphine 20 MG/ML concentrated solution Commonly known as:  ROXANOL Take 0.5 mLs (10 mg total) by mouth every 2 (two) hours as needed for severe pain or anxiety.   nebivolol 5 MG tablet Commonly known as:  BYSTOLIC Take 1 tablet (5 mg total) by mouth daily.   ondansetron 4 MG tablet Commonly known as:  ZOFRAN Take 1 tablet (4 mg total) by mouth every 6 (six) hours as needed for nausea.  ranitidine 150 MG tablet Commonly known as:  ZANTAC Take 1 tablet (150 mg total) by mouth 2 (two) times daily.        DISCHARGE INSTRUCTIONS:    Follow with PMD in 1-2 weeks as needed, Hospice at [facility] for comfort measures.  If you experience worsening of your admission symptoms, develop shortness of breath, life threatening emergency,  suicidal or homicidal thoughts you must seek medical attention immediately by calling 911 or calling your MD immediately  if symptoms less severe.  You Must read complete instructions/literature along with all the possible adverse reactions/side effects for all the Medicines you take and that have been prescribed to you. Take any new Medicines after you have completely understood and accept all the possible adverse reactions/side effects.   Please note  You were cared for by a hospitalist during your hospital stay. If you have any questions about your discharge medications or the care you received while you were in the hospital after you are discharged, you can call the unit and asked to speak with the hospitalist on call if the hospitalist that took care of you is not available. Once you are discharged, your primary care physician will handle any further medical issues. Please note that NO REFILLS for any discharge medications will be authorized once you are discharged, as it is imperative that you return to your primary care physician (or establish a relationship with a primary care physician if you do not have one) for your aftercare needs so that they can reassess your need for medications and monitor your lab values.    Today   CHIEF COMPLAINT:   Chief Complaint  Patient presents with  . Shortness of Breath    HISTORY OF PRESENT ILLNESS:  Eugene Burton  is a 58 y.o. male with a known history of recently diagnosed lung metastasis and pancreatic cancer stage IV, hypertension, GERD and deaf.  The patient was discharged recently after biopsy in the hospital.  He is put on oxygen by nasal cannula at home due to hypoxia.  He started to have a worsening shortness of breath this morning and was sent to the hospital today.  He is found in respiratory distress and put on BiPAP.  Chest x-ray show pulmonary edema or pneumonia or lung metastasis.  He is treated with Lasix and antibiotics in the ED.  He  only complains of shortness of breath and a cough but denies any other symptoms.  He also mentioned bilateral leg edema for 1 week when I found bilateral edema 1+ during physical exam.   VITAL SIGNS:  Blood pressure (!) 90/57, pulse (!) 102, temperature 97.9 F (36.6 C), temperature source Oral, resp. rate 14, height 5' 2.99" (1.6 m), weight 43.2 kg, SpO2 97 %.  I/O:    Intake/Output Summary (Last 24 hours) at 10/05/2018 1301 Last data filed at 10/05/2018 0449 Gross per 24 hour  Intake 861.71 ml  Output 350 ml  Net 511.71 ml    PHYSICAL EXAMINATION:   GENERAL:  58 y.o.-year-old patient lying in the bed with no acute distress.  EYES: Pupils equal, round, reactive to light and accommodation. No scleral icterus. Extraocular muscles intact.  HEENT: Head atraumatic, normocephalic. Oropharynx and nasopharynx clear.  NECK:  Supple, no jugular venous distention. No thyroid enlargement, no tenderness.  LUNGS: Improved breath sounds bilaterally,. No use of accessory muscles of respiration.  Scattered Rales heard in both lungs CARDIOVASCULAR: S1, S2 normal. No murmurs, rubs, or gallops.  ABDOMEN: Soft, nontender, nondistended.  Bowel sounds present. No organomegaly or mass.  EXTREMITIES: No cyanosis, clubbing or edema b/l.    NEUROLOGIC: Cranial nerves II through XII are intact. No focal Motor or sensory deficits b/l.   PSYCHIATRIC: The patient is alert and oriented x 3.  SKIN: No obvious rash, lesion, or ulcer.   DATA REVIEW:   CBC Recent Labs  Lab 10/03/18 0505  WBC 14.4*  HGB 8.5*  HCT 26.0*  PLT 182    Chemistries  Recent Labs  Lab 09/30/18 0404  10/03/18 0505  NA 131*   < > 129*  K 3.0*   < > 3.6  CL 87*   < > 83*  CO2 35*   < > 35*  GLUCOSE 145*   < > 111*  BUN 17   < > 23*  CREATININE 0.74   < > 0.79  CALCIUM 8.3*   < > 8.7*  MG 2.3  --   --    < > = values in this interval not displayed.    Cardiac Enzymes No results for input(s): TROPONINI in the last 168  hours.  Microbiology Results  Results for orders placed or performed during the hospital encounter of 09/28/18  MRSA PCR Screening     Status: None   Collection Time: 09/28/18  5:00 PM  Result Value Ref Range Status   MRSA by PCR NEGATIVE NEGATIVE Final    Comment:        The GeneXpert MRSA Assay (FDA approved for NASAL specimens only), is one component of a comprehensive MRSA colonization surveillance program. It is not intended to diagnose MRSA infection nor to guide or monitor treatment for MRSA infections. Performed at [facility], [address] [place], [place] [address]   Culture, blood (routine x 2) Call MD if unable to obtain prior to antibiotics being given     Status: None   Collection Time: 09/28/18  6:43 PM  Result Value Ref Range Status   Specimen Description BLOOD BLOOD RIGHT HAND  Final   Special Requests   Final    BOTTLES DRAWN AEROBIC AND ANAEROBIC Blood Culture results may not be optimal due to an inadequate volume of blood received in culture bottles   Culture   Final    NO GROWTH 5 DAYS Performed at [facility], [address] [place], [place] [address]    Report Status 10/03/2018 FINAL  Final  Culture, blood (routine x 2) Call MD if unable to obtain prior to antibiotics being given     Status: None   Collection Time: 09/28/18  6:43 PM  Result Value Ref Range Status   Specimen Description BLOOD BLOOD LEFT HAND  Final   Special Requests   Final    BOTTLES DRAWN AEROBIC AND ANAEROBIC Blood Culture adequate volume   Culture   Final    NO GROWTH 5 DAYS Performed at [facility], [address], [place], [place] [address]    Report Status 10/03/2018 FINAL  Final    RADIOLOGY:  No results found.  EKG:   Orders placed or performed during the hospital encounter of 09/28/18  . ED EKG  . ED EKG  . EKG 12-Lead  . EKG 12-Lead  . EKG 12-Lead  . EKG 12-Lead      Management plans discussed with the patient, family  and they are in agreement.  CODE STATUS:     Code Status Orders  (From admission, onward)  Start     Ordered   09/29/18 1224  Do not attempt resuscitation (DNR)  Continuous    Question Answer Comment  In the event of cardiac or respiratory ARREST Do not call a "code blue"   In the event of cardiac or respiratory ARREST Do not perform Intubation, CPR, defibrillation or ACLS   In the event of cardiac or respiratory ARREST Use medication by any route, position, wound care, and other measures to relive pain and suffering. May use oxygen, suction and manual treatment of airway obstruction as needed for comfort.      09/29/18 1224        Code Status History    Date Active Date Inactive Code Status Order ID Comments User Context   09/28/2018 1828 09/29/2018 1224 Full Code [id number]  Demetrios Loll, MD Inpatient   09/19/2018 0328 09/22/2018 1629 Full Code [id number]  Arta Silence, MD ED    Advance Directive Documentation     Most Recent Value  Type of Advance Directive  Healthcare Power of Attorney, Living will  Pre-existing out of facility DNR order (yellow form or pink MOST form)  -  "MOST" Form in Place?  -      TOTAL TIME TAKING CARE OF THIS PATIENT: 35 minutes.    Vaughan Basta M.D on 10/05/2018 at 1:01 PM  Between 7am to 6pm - Pager - [phone]  After 6pm go to [email or web address] - password [email or web address] [facility]  Office  [phone]  CC: Primary care physician; Patient, No Pcp Per   Note: This dictation was prepared with Dragon dictation along with smaller phrase technology. Any transcriptional errors that result from this process are unintentional.

## 2018-10-05 NOTE — Telephone Encounter (Signed)
Ok for Dr. B to manage pt's hospice orders

## 2018-10-05 NOTE — Telephone Encounter (Signed)
Patient discharging form hospital and asking for verbal orders for Dr B to be his hospice doc.Marland Kitchen Please return call [phone]

## 2018-10-05 NOTE — Clinical Social Work Note (Signed)
CSW spoke with Rachel Bo at the [facility] regarding patient. Rachel Bo states that they can accept patient today with hospice services through [facility].  [facility] can also transport patient today. CSW will speak with MD and facilitate discharge today.   [place], [place]

## 2018-10-05 NOTE — Discharge Instructions (Signed)
Hospice to follow after discharge.

## 2018-10-05 NOTE — Clinical Social Work Note (Signed)
Patient is medically ready for discharge today. CSW notified patient's brother/POA Linkin Vizzini [phone] of discharge to [facility]. Facility will be here at 3 pm to transport patient.  [place], [place]

## 2018-10-05 NOTE — Progress Notes (Signed)
New referral for [facility] at [facility] received from [place]. Patient is a 58 year old man with multiple medical problems including stage IV colon cancer, chronic respiratory failure requiring O2, deafness, malnutrition, GERD, and hypertension.Patient was recently hospitalized 09/18/2018 to 09/22/2018 with cough and dehydration and was found to have multiple lung masses bilaterally on CT scan in addition abdominal CT revealed pancreatic lesions, liver lesions, a mass in the left kidney, and peritoneal carcinomatosis. He underwent biopsy of the omental lesion with pathology showing metastatic adenocarcinoma likely of colorectal origin. Palliative medicine was consulted for goals of care and have met with patient and his family over the course of this admission. Of note patient also has an open APS case relate to self neglect. Patient, family and APS aware of plan to discharge to [facility] today.  Writer met in the room with patient using the video remote interpreter (American sign language)  to initiate education regarding hospice services, philosophy and team approach to care with understanding confirmed. Patient remained in bed and declined any food or drink during visit. He remains on oxygen at 2 liters via nasal cannula. Portable oxygen to be delivered to [facility] rm [address] and concentrator to be delivered to [facility]. Hospital care team updated. Notes faxed to referral. Thank you. Flo Shanks RN, BSN, [facility] [place], hospital liaison [phone]

## 2018-10-11 ENCOUNTER — Inpatient Hospital Stay: Payer: Medicare HMO | Admitting: Internal Medicine

## 2018-10-11 ENCOUNTER — Inpatient Hospital Stay: Payer: Medicare HMO

## 2018-10-11 ENCOUNTER — Inpatient Hospital Stay: Payer: Medicare HMO | Admitting: Hospice and Palliative Medicine

## 2018-10-14 DEATH — deceased

## 2018-12-21 ENCOUNTER — Ambulatory Visit: Payer: Self-pay | Admitting: Ophthalmology

## 2019-06-20 IMAGING — CT CT CHEST W/ CM
2 of 5 series · 11 of 36 positions shown, 13 images · IV contrast (omnipaque)
Comparison: Same day CXR

EXAM:
CT CHEST, ABDOMEN, AND PELVIS WITH CONTRAST
TECHNIQUE: Multidetector CT imaging of the chest, abdomen and pelvis was
performed following the standard protocol during bolus
administration of intravenous contrast.

CONTRAST:  75mL OMNIPAQUE IOHEXOL 300 MG/ML  SOLN

[Series 2: cap with · axial · 0.64mm/px · z∈[-372,+108]mm · 8 of 118 slices shown, 10 images]
[im 11/118  mediastinal]
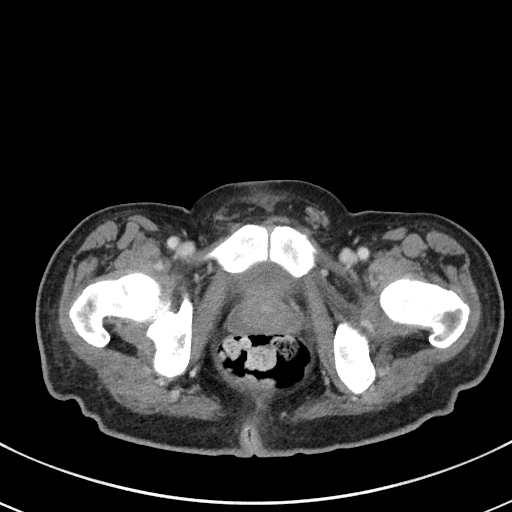
[im 11/118  lung]
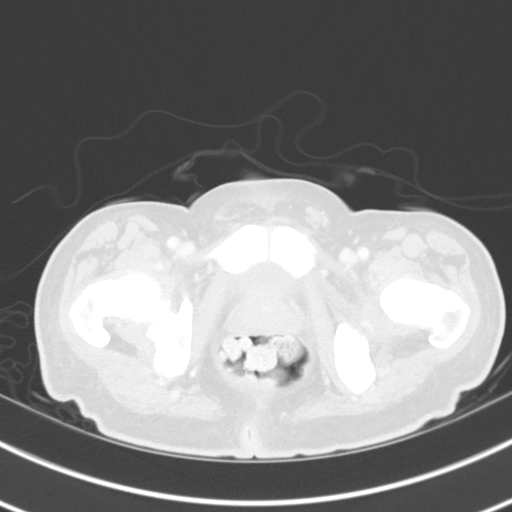
[im 22/118  lung]
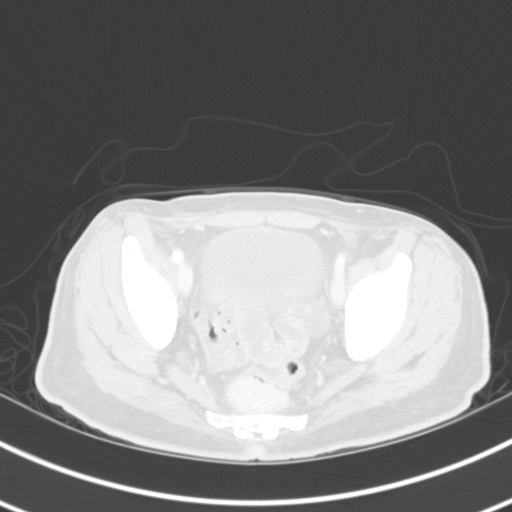
[im 43/118  lung]
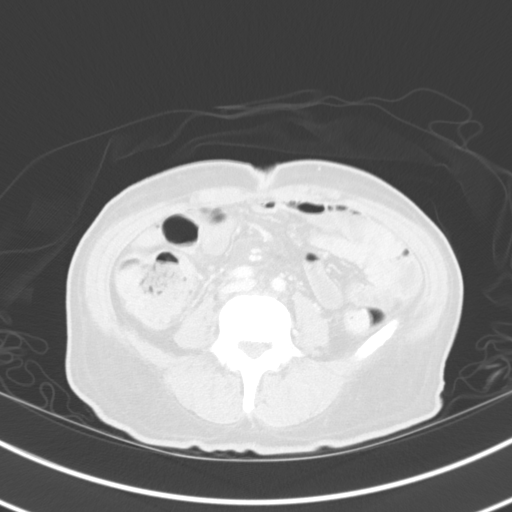
[im 54/118  lung]
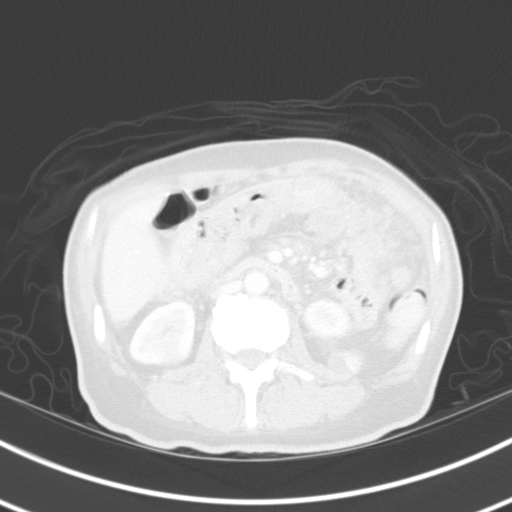
[im 64/118  mediastinal]
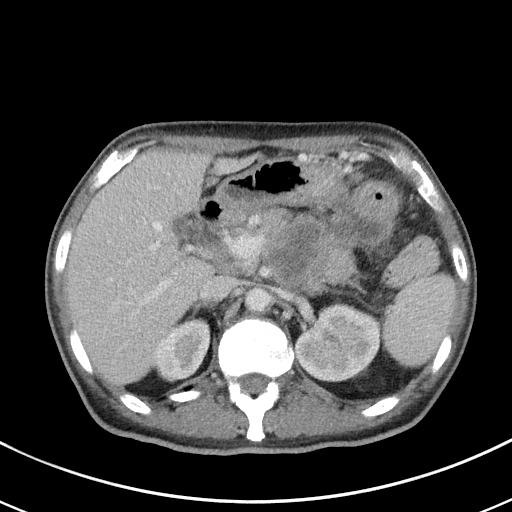
[im 64/118  lung]
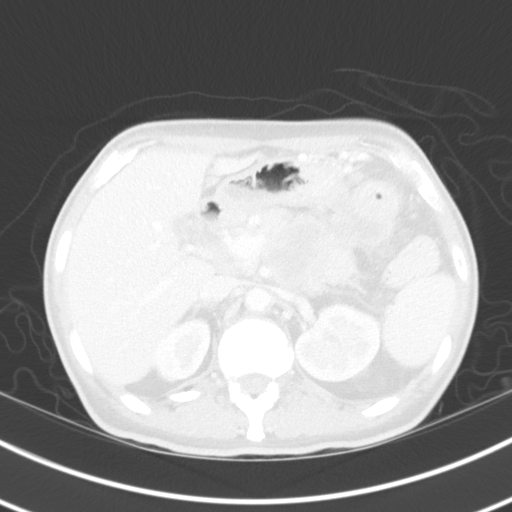
[im 75/118  lung]
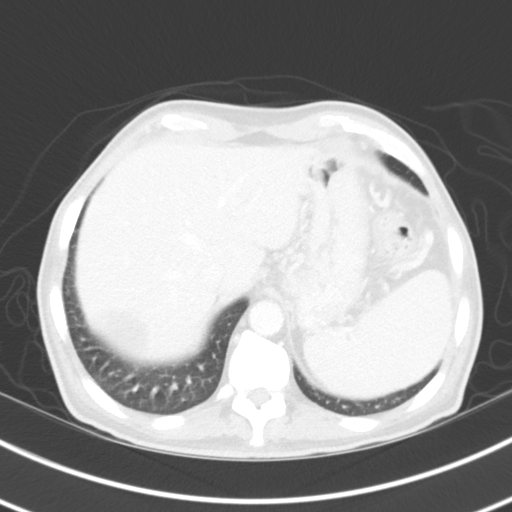
[im 96/118  lung]
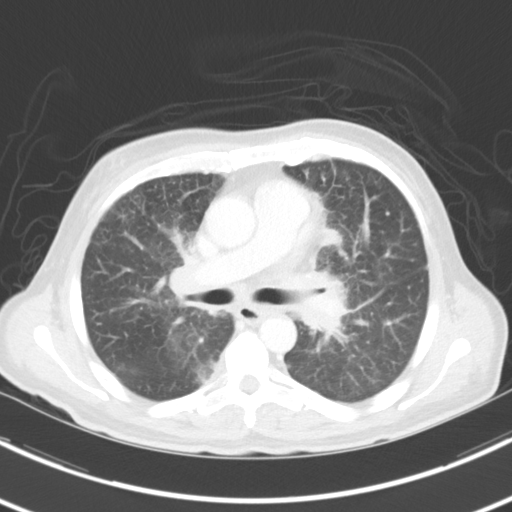
[im 107/118  lung]
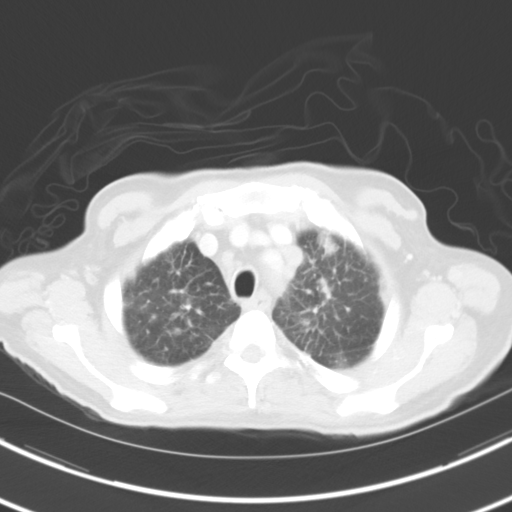

[Series 5: coronals · coronal · 0.64mm/px · 3 of 112 slices shown]
[im 23/112  lung]
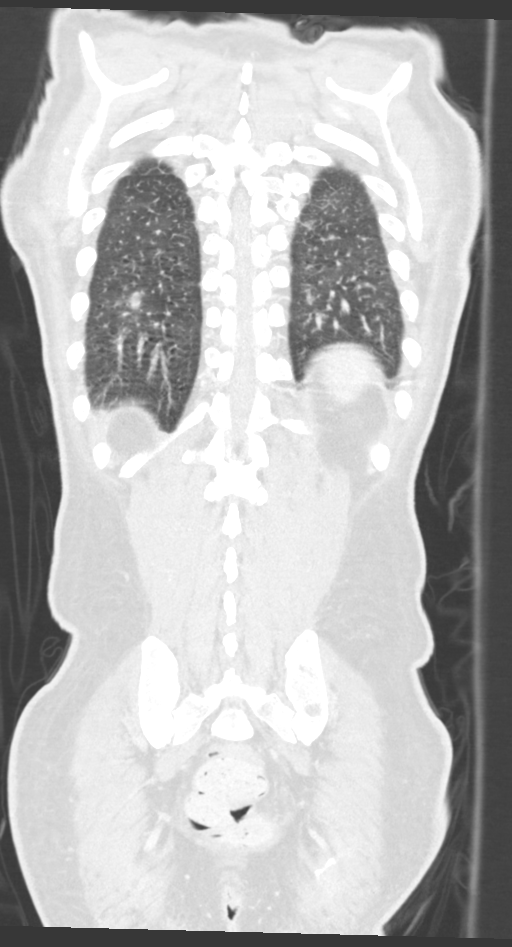
[im 45/112  lung]
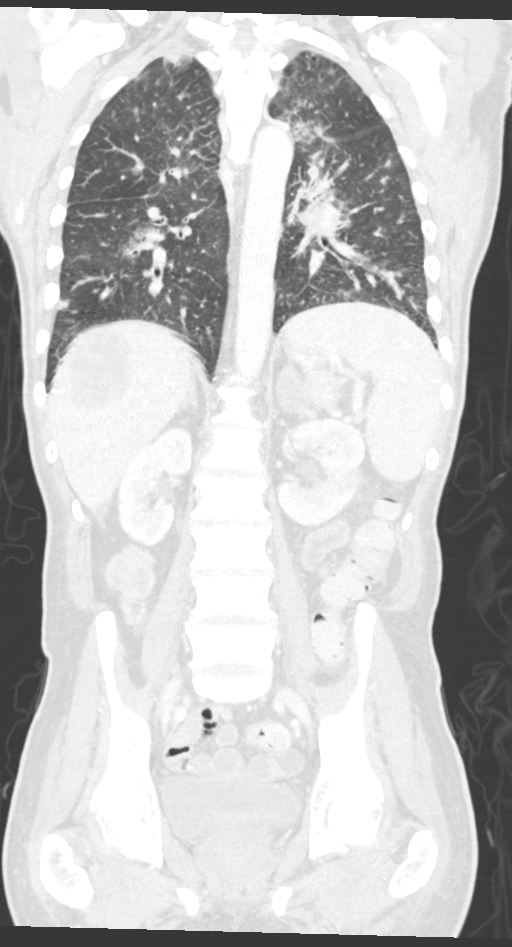
[im 67/112  lung]
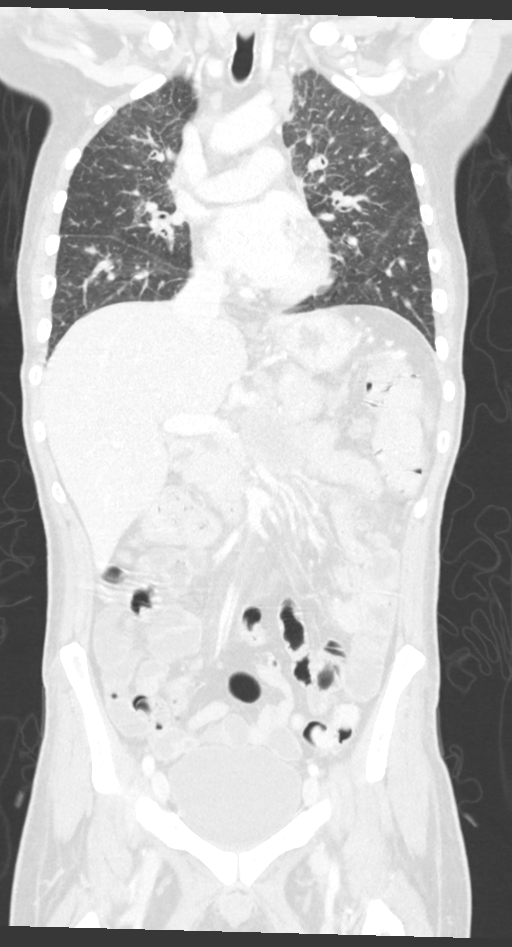

[11 of 36 positions shown; findings below may reference images not displayed]

FINDINGS: CT CHEST FINDINGS

Cardiovascular: Conventional branch pattern of the great vessels.
Nonaneurysmal thoracic aorta. No large central pulmonary embolus.
Heart size is top normal without pericardial effusion or thickening.

Mediastinum/Nodes: Mild thyromegaly with retroclavicular extension
of the thyroid gland. No dominant mass. Midline patent trachea.
Mainstem bronchi appear patent. Mild enlargement of mediastinal
lymph nodes in the right upper paratracheal, prevascular, left lower
paratracheal and bilateral hilar lymph node stations ranging in size
from 10 mm through 12 mm. The CT appearance of the esophagus is
unremarkable.

Lungs/Pleura: Interstitial thickening likely representing
interstitial edema with ill-defined patchy multilobar masslike
bilateral airspace opacities and peribronchial thickening are
identified bilaterally. The largest is noted in the superior segment
of left lower lobe measuring approximately 2 x 2.4 x 1.8 cm with
smaller ill-defined nodular opacities scattered throughout both
lungs. No effusion or pneumothorax.

Musculoskeletal: No chest wall mass or suspicious bone lesions
identified.

CT ABDOMEN PELVIS FINDINGS

Hepatobiliary: Concerning ill-defined hypodense lesion in the
posterior segment of right hepatic lobe measuring 4.1 x 3.7 x 4.7 cm
in transverse by AP by craniocaudad suspicious for hepatic
metastasis. No biliary dilatation. Nondistended gallbladder
containing a 1.6 cm gallstone.

Pancreas: Hypodense ill-defined mass measuring 4.8 x 4.5 x 4.4 cm
centered within the body of the pancreas concerning for pancreatic
carcinoma. No ductal dilatation. Patent portal and splenic veins.

Spleen: Normal

Adrenals/Urinary Tract: Bilobed hypodense masslike abnormality in
the left adrenal gland likely representing metastatic disease
measuring at least 3.8 x 1.6 cm, series [DATE]. Exophytic complex
cystic mass arising lower pole of the left kidney measuring 4.5 x
3.6 x 5 cm with mural nodule along the caudal aspect measuring up to
1.2 x 1.5 x 1.8 cm. Hypodense lesions of the right kidney are more
likely to represent simple cysts though some are too small to
further characterize. The largest is in the lower pole posteriorly
measuring 1.8 cm in diameter.

Stomach/Bowel: The stomach is nondistended. Normal small bowel
rotation. Slight fluid-filled distention of distal and mid small
bowel loops without mechanical obstruction. Normal appendix,
terminal and distal ileum. Moderate transmural thickening of the
transverse colon with ill-defined masslike abnormality involving the
distal transverse colon measuring 5.2 x 7.4 x 2.9 cm, series [DATE]
with single wall thickness up to 2.4 cm. There are adjacent
peritoneal masses concerning for peritoneal carcinomatosis measuring
1.7 cm in the left hemiabdomen, series [DATE] with smaller mesenteric
nodules in the left upper quadrant measuring up to 0.8 cm and
cm. Mesenteric edema and mesenteric nodules are also noted further
caudad within the left hemiabdomen, the largest measuring up to 2
cm, series [DATE]. Smaller left pelvic mesenteric nodule measuring
cm is seen on the left.

Vascular/Lymphatic: Mild aortoiliac atherosclerosis without aneurysm
or dissection. No retroperitoneal or definite mesenteric adenopathy.

Reproductive: Prostate is unremarkable.

Other: No free air or free fluid.

Musculoskeletal: Suspicious osteolytic appearance of the right
femoral head on axial image 107, series 2. A more circumscribed left
femoral neck lesion measuring up to 1.5 cm with ill-defined right
posterior iliac bone 2.1 cm lesion.
IMPRESSION: Chest CT:

1. Ill-defined masslike opacities scattered throughout both lungs
with associated hilar and mediastinal lymphadenopathy concerning for
metastatic disease. Given the masslike abnormalities noted within
the abdomen involving the pancreas and transverse colon, suspect
that these are more likely metastatic and not due to a primary
pulmonary neoplasm.
2. Nonaneurysmal thoracic aorta.
3. No acute pulmonary embolus.

CT AP:

1. Hypodense mass centered within the body of the pancreas measuring
4.8 x 4.5 x 4.4 cm concerning for pancreatic carcinoma.
2. Masslike abnormality of the distal transverse colon measuring
x 7.4 x 2.9 cm with single wall thickness up to 2.4 cm. No bowel
obstruction. This is also concerning for colonic neoplasm.
3. Hypodense mass of the right hepatic lobe measuring 4.1 x 3.7 x
4.7 cm concerning for hepatic metastasis.
4. Cystic mass arising off the lower pole of the left kidney with
solid nodular component measuring 4.5 x 3.6 x 5 cm with the mural
nodule measuring 1.2 x 1.5 x 1.8 cm. Cystic renal cell carcinoma is
not excluded.
5. Findings concerning for peritoneal carcinomatosis with mesenteric
nodules as above described. No ascites however is identified.
6. Osteolytic lesions of the right iliac bone and both proximal
femora.

These results were called by telephone at the time of interpretation
on 09/18/2018 at [DATE] to Dr. Koomar , who verbally acknowledged
these results.

## 2019-06-23 IMAGING — CT CT BIOPSY
1 of 2 series · 14 of 32 positions shown, 19 images · non-contrast
Comparison: none

CLINICAL DATA: Pancreatic mass, peritoneal masses, liver mass, left
renal mass, left adrenal mass and pulmonary masses. Presumed
metastatic pancreatic carcinoma. Presenting for biopsy of one of the
peritoneal masses.

[Series 2: i-spiral 5.0 b30f · axial · 0.55mm/px · z∈[+674,+779]mm · 14 of 34 slices shown, 19 images]
[im 2/34  soft-tissue]
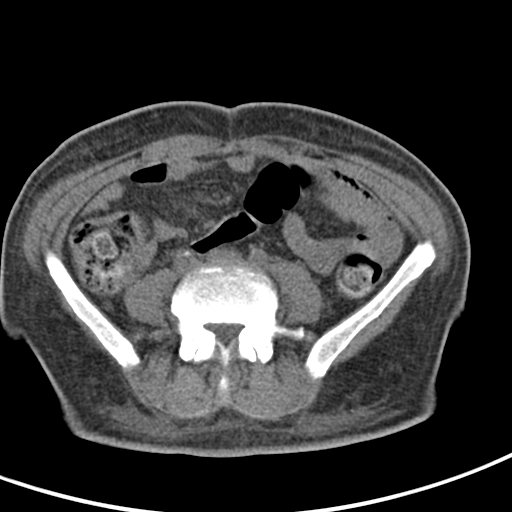
[im 2/34  bone]
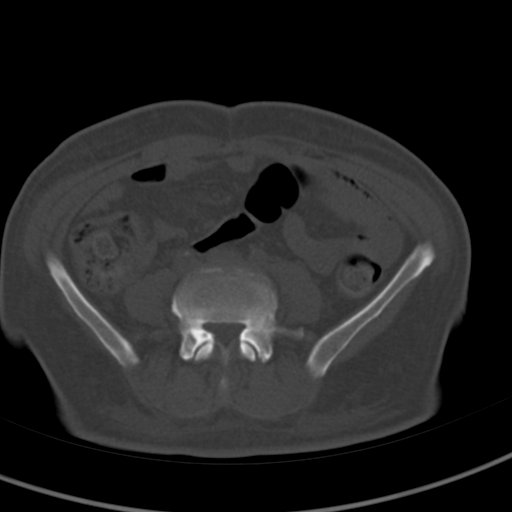
[im 6/34  soft-tissue]
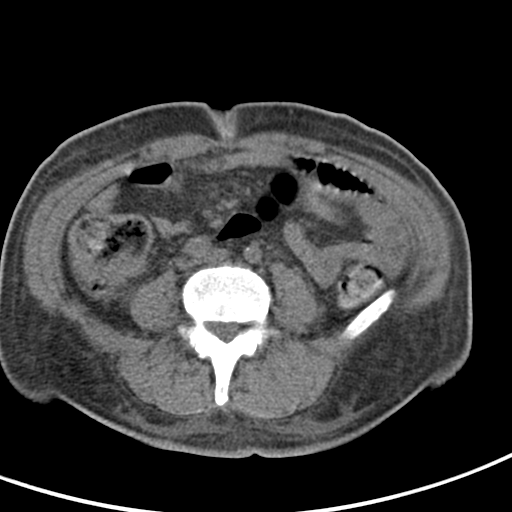
[im 7/34  soft-tissue]
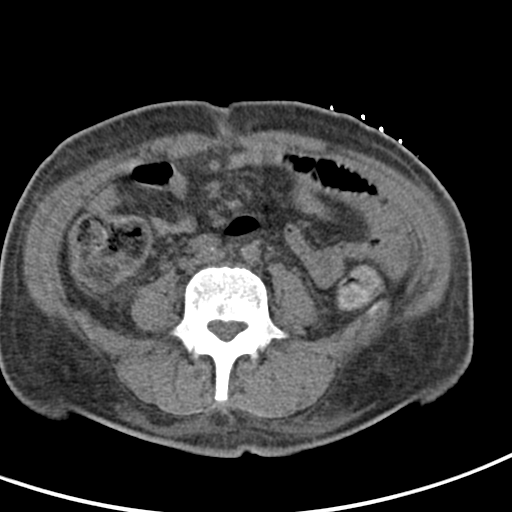
[im 9/34  soft-tissue]
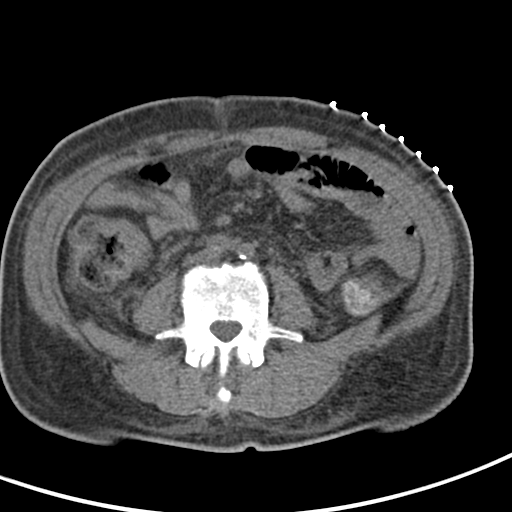
[im 13/34  soft-tissue]
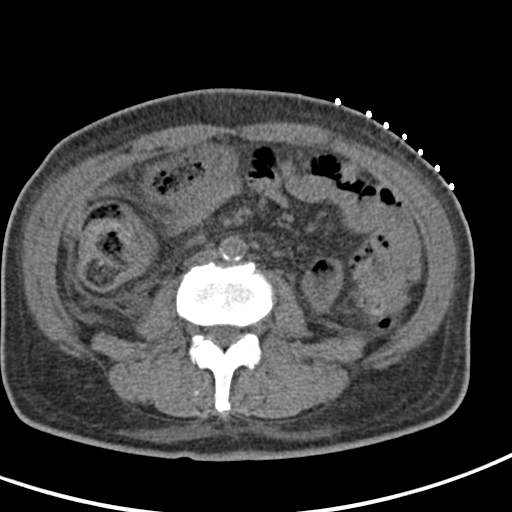
[im 14/34  soft-tissue]
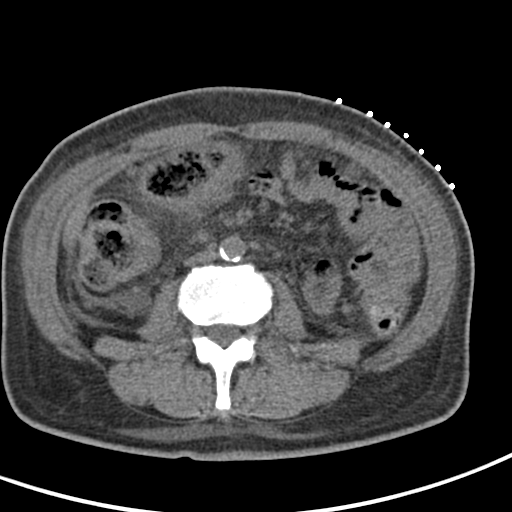
[im 18/34  soft-tissue]
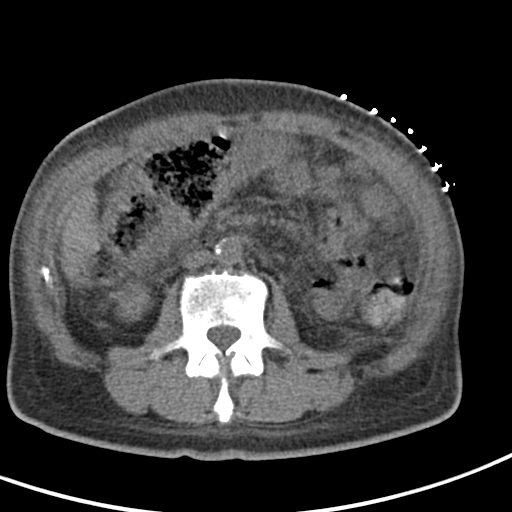
[im 20/34  soft-tissue]
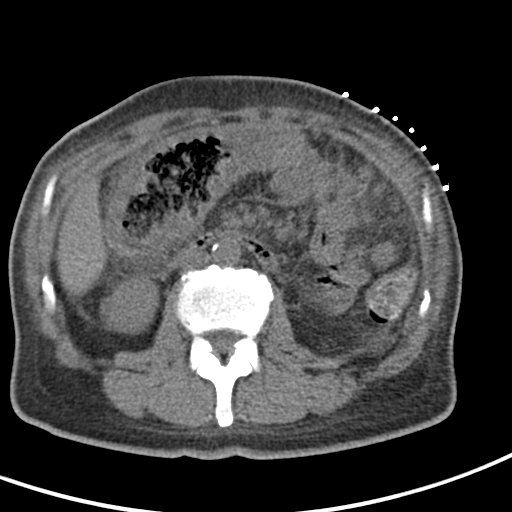
[im 21/34  soft-tissue]
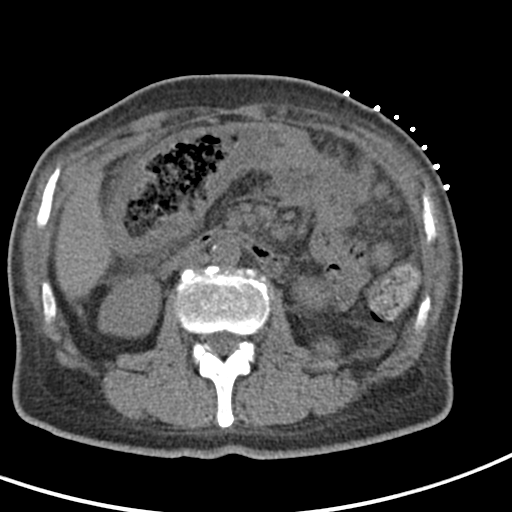
[im 21/34  bone]
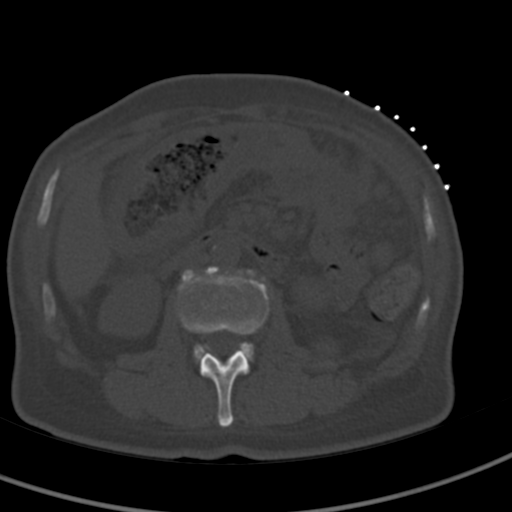
[im 25/34  soft-tissue]
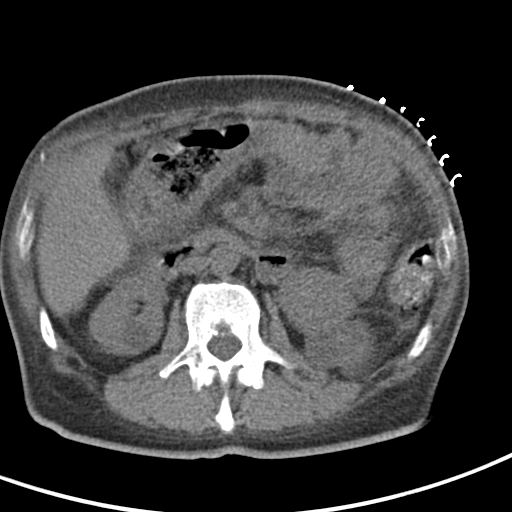
[im 27/34  soft-tissue]
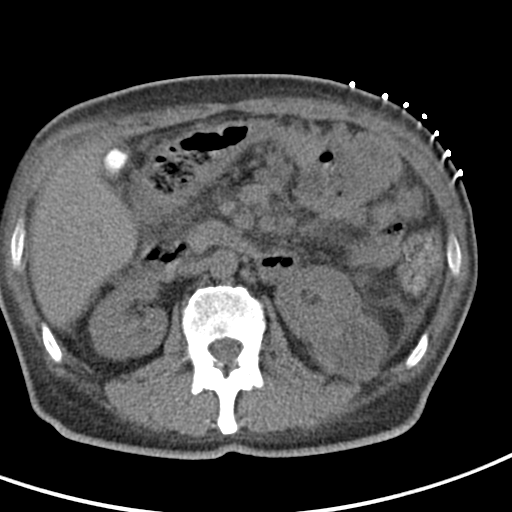
[im 27/34  lung]
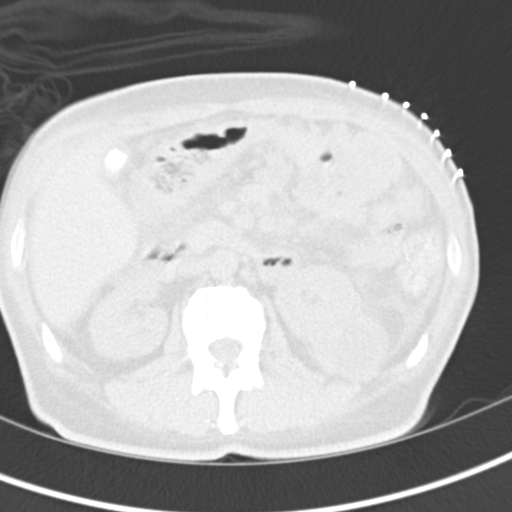
[im 28/34  soft-tissue]
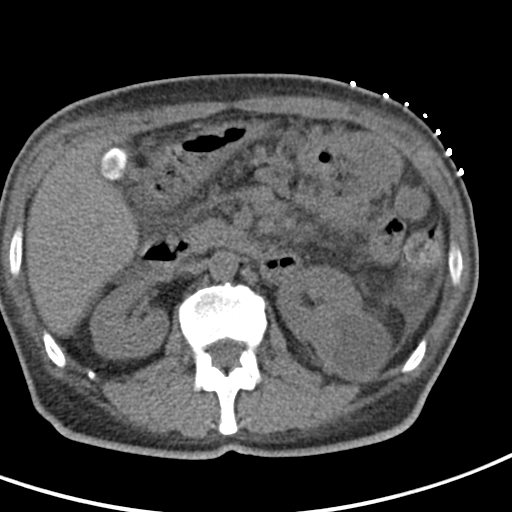
[im 28/34  lung]
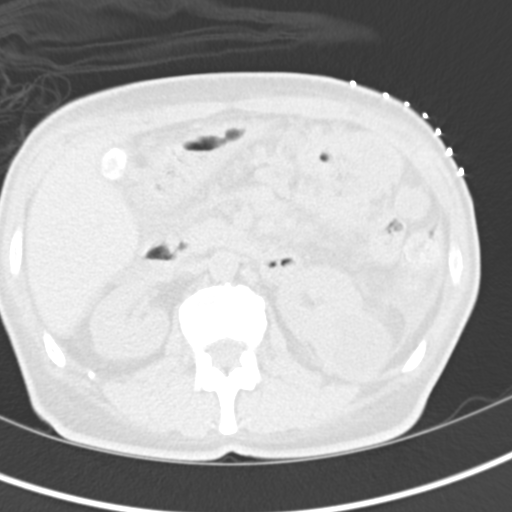
[im 30/34  lung]
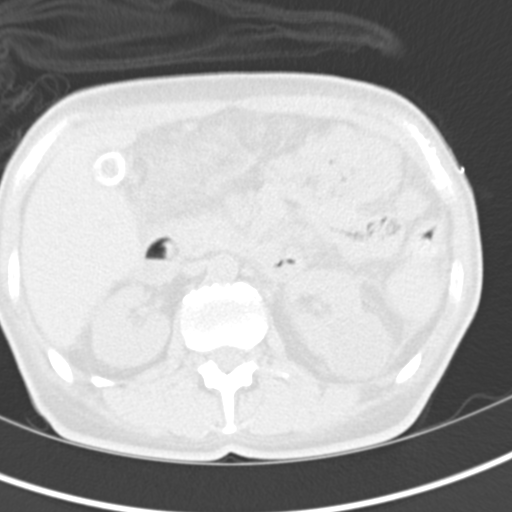
[im 32/34  soft-tissue]
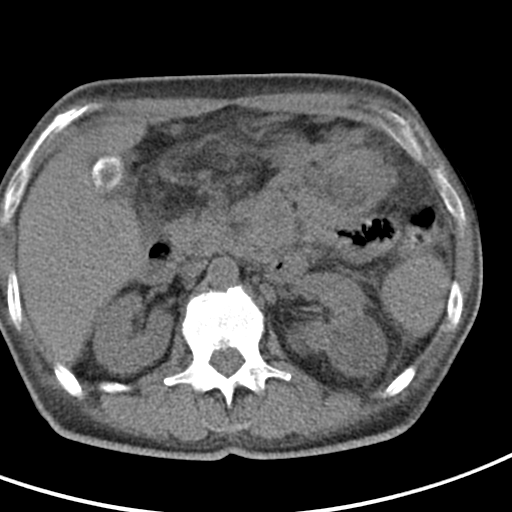
[im 32/34  lung]
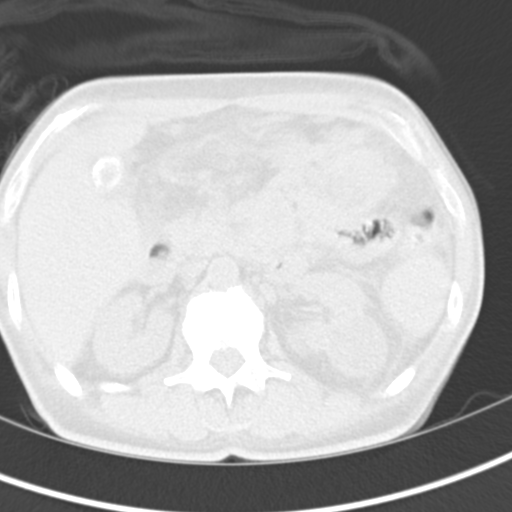

[14 of 32 positions shown; findings below may reference images not displayed]

EXAM:
CT GUIDED CORE BIOPSY OF PERITONEAL MASS

ANESTHESIA/SEDATION:
2.0 mg IV Versed; 75 mcg IV Fentanyl

Total Moderate Sedation Time:  42 minutes.

The patient's level of consciousness and physiologic status were
continuously monitored during the procedure by Radiology nursing.

PROCEDURE:
The procedure risks, benefits, and alternatives were explained to
the patient. Questions regarding the procedure were encouraged and
answered. The patient understands and consents to the procedure. A
time-out was performed prior to initiating the procedure.

The operative field was prepped with chlorhexidine in a sterile
fashion, and a sterile drape was applied covering the operative
field. A sterile gown and sterile gloves were used for the
procedure. Local anesthesia was provided with 1% Lidocaine.

CT was performed in a supine position through the mid to lower
abdomen. A site was chosen for biopsy along the left abdominal wall.
Under CT guidance, a 17 gauge trocar needle was advanced. After
confirming needle tip position, a total of 4 coaxial 18 gauge core
biopsy samples were obtained and submitted in formalin. Additional
CT was performed after biopsy.

COMPLICATIONS:
None
FINDINGS: Peritoneal soft tissue nodule in the left mid lateral peritoneal
cavity was chosen for biopsy and measures approximately 2 cm in
maximal diameter. Solid tissue was obtained. There were no immediate
complications.
IMPRESSION: CT-guided core biopsy performed of peritoneal mass in the left mid
lateral abdomen.
